# Patient Record
Sex: Female | Born: 1980 | Race: White | Hispanic: No | Marital: Single | Smoking: Former smoker
Health system: Southern US, Community
[De-identification: ages and names within clinical notes are randomized; demographics above are authoritative.]

## PROBLEM LIST (undated history)

## (undated) DIAGNOSIS — L309 Dermatitis, unspecified: Secondary | ICD-10-CM

## (undated) DIAGNOSIS — F419 Anxiety disorder, unspecified: Secondary | ICD-10-CM

## (undated) DIAGNOSIS — F3281 Premenstrual dysphoric disorder: Secondary | ICD-10-CM

## (undated) DIAGNOSIS — D219 Benign neoplasm of connective and other soft tissue, unspecified: Secondary | ICD-10-CM

## (undated) DIAGNOSIS — R112 Nausea with vomiting, unspecified: Secondary | ICD-10-CM

## (undated) DIAGNOSIS — R102 Pelvic and perineal pain: Secondary | ICD-10-CM

## (undated) DIAGNOSIS — Z7689 Persons encountering health services in other specified circumstances: Secondary | ICD-10-CM

## (undated) DIAGNOSIS — N946 Dysmenorrhea, unspecified: Secondary | ICD-10-CM

## (undated) DIAGNOSIS — IMO0002 Reserved for concepts with insufficient information to code with codable children: Secondary | ICD-10-CM

## (undated) DIAGNOSIS — N898 Other specified noninflammatory disorders of vagina: Secondary | ICD-10-CM

## (undated) DIAGNOSIS — D259 Leiomyoma of uterus, unspecified: Secondary | ICD-10-CM

## (undated) DIAGNOSIS — J45909 Unspecified asthma, uncomplicated: Secondary | ICD-10-CM

## (undated) DIAGNOSIS — B379 Candidiasis, unspecified: Secondary | ICD-10-CM

## (undated) DIAGNOSIS — N943 Premenstrual tension syndrome: Secondary | ICD-10-CM

## (undated) HISTORY — DX: Dysmenorrhea, unspecified: N94.6

## (undated) HISTORY — DX: Candidiasis, unspecified: B37.9

## (undated) HISTORY — DX: Unspecified asthma, uncomplicated: J45.909

## (undated) HISTORY — DX: Dermatitis, unspecified: L30.9

## (undated) HISTORY — DX: Leiomyoma of uterus, unspecified: D25.9

## (undated) HISTORY — DX: Premenstrual dysphoric disorder: F32.81

## (undated) HISTORY — DX: Pelvic and perineal pain: R10.2

## (undated) HISTORY — DX: Nausea with vomiting, unspecified: R11.2

## (undated) HISTORY — DX: Persons encountering health services in other specified circumstances: Z76.89

## (undated) HISTORY — DX: Other specified noninflammatory disorders of vagina: N89.8

## (undated) HISTORY — DX: Premenstrual tension syndrome: N94.3

## (undated) HISTORY — PX: CHOLECYSTECTOMY: SHX55

## (undated) HISTORY — DX: Anxiety disorder, unspecified: F41.9

## (undated) HISTORY — DX: Benign neoplasm of connective and other soft tissue, unspecified: D21.9

## (undated) HISTORY — DX: Reserved for concepts with insufficient information to code with codable children: IMO0002

---
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
## 2002-08-05 ENCOUNTER — Other Ambulatory Visit: Admission: RE | Admit: 2002-08-05 | Discharge: 2002-08-05 | Payer: Self-pay | Admitting: Obstetrics & Gynecology

## 2005-04-11 ENCOUNTER — Ambulatory Visit: Payer: Self-pay | Admitting: Internal Medicine

## 2005-05-17 ENCOUNTER — Ambulatory Visit: Payer: Self-pay | Admitting: Internal Medicine

## 2005-05-21 ENCOUNTER — Ambulatory Visit (HOSPITAL_COMMUNITY): Admission: RE | Admit: 2005-05-21 | Discharge: 2005-05-21 | Payer: Self-pay | Admitting: Internal Medicine

## 2005-08-15 ENCOUNTER — Ambulatory Visit: Payer: Self-pay | Admitting: Internal Medicine

## 2006-06-13 ENCOUNTER — Emergency Department (HOSPITAL_COMMUNITY): Admission: EM | Admit: 2006-06-13 | Discharge: 2006-06-13 | Payer: Self-pay | Admitting: Emergency Medicine

## 2010-03-22 ENCOUNTER — Ambulatory Visit (HOSPITAL_COMMUNITY): Admission: RE | Admit: 2010-03-22 | Discharge: 2010-03-22 | Payer: Self-pay | Admitting: Internal Medicine

## 2010-05-01 ENCOUNTER — Ambulatory Visit: Payer: Self-pay | Admitting: Gastroenterology

## 2010-05-01 DIAGNOSIS — R1013 Epigastric pain: Secondary | ICD-10-CM

## 2010-05-01 DIAGNOSIS — R740 Nonspecific elevation of levels of transaminase and lactic acid dehydrogenase [LDH]: Secondary | ICD-10-CM

## 2010-05-01 DIAGNOSIS — K8020 Calculus of gallbladder without cholecystitis without obstruction: Secondary | ICD-10-CM | POA: Insufficient documentation

## 2010-05-01 DIAGNOSIS — K219 Gastro-esophageal reflux disease without esophagitis: Secondary | ICD-10-CM

## 2010-05-01 DIAGNOSIS — K589 Irritable bowel syndrome without diarrhea: Secondary | ICD-10-CM

## 2010-05-03 ENCOUNTER — Encounter: Payer: Self-pay | Admitting: Internal Medicine

## 2010-05-19 ENCOUNTER — Encounter: Payer: Self-pay | Admitting: Internal Medicine

## 2010-05-29 ENCOUNTER — Ambulatory Visit: Payer: Self-pay | Admitting: Internal Medicine

## 2010-05-29 ENCOUNTER — Ambulatory Visit (HOSPITAL_COMMUNITY): Admission: RE | Admit: 2010-05-29 | Discharge: 2010-05-29 | Payer: Self-pay | Admitting: Internal Medicine

## 2010-07-22 ENCOUNTER — Observation Stay (HOSPITAL_COMMUNITY): Admission: EM | Admit: 2010-07-22 | Discharge: 2010-07-22 | Payer: Self-pay | Admitting: Emergency Medicine

## 2010-08-15 ENCOUNTER — Encounter: Payer: Self-pay | Admitting: Internal Medicine

## 2010-09-14 ENCOUNTER — Encounter: Payer: Self-pay | Admitting: Internal Medicine

## 2010-09-15 ENCOUNTER — Encounter: Payer: Self-pay | Admitting: Internal Medicine

## 2010-10-21 ENCOUNTER — Encounter (INDEPENDENT_AMBULATORY_CARE_PROVIDER_SITE_OTHER): Payer: Self-pay | Admitting: Internal Medicine

## 2010-10-31 NOTE — Letter (Signed)
Summary: [facility]  [facility]   Imported By: Rexene Alberts 05/03/2010 15:13:49  _____________________________________________________________________  External Attachment:    Type:   Image     Comment:   External Document

## 2010-10-31 NOTE — Letter (Signed)
Summary: [facility] CLINIC NOTE SURGERY  [facility] CLINIC NOTE SURGERY   Imported By: Rexene Alberts 08/15/2010 15:02:48  _____________________________________________________________________  External Attachment:    Type:   Image     Comment:   External Document

## 2010-10-31 NOTE — Assessment & Plan Note (Signed)
Summary: epigastric pain,nausea,vomiting/ss   Visit Type:  Initial Consult Referring Provider:  Free Webster Primary Care Provider:  Free Webster  Chief Complaint:  epigastric pain/n/v.  History of Present Illness: Lori Webster is a pleasant 30 y/o WF, patient of the Lori Webster, who presents for further evaluation of 7 month h/o progressive epigastric pain.  Symptoms started in Jan. 2011. First started with heartburn. Took TUMS but didn't help. Three weeks later. Epigastric pain, would wake up with it. Now will begin with bloating and gassy stomach before the epigastric pain. All happens within 30 mins. Not really related to meals necessarily. Would still have pain with fasting. Now some symptoms during day. Pain is severe when it occurs. At first tried Pepcid, no help. Then Dexilant, helped for little awhile but started back in 2 months. Lori Webster gave Zantac.  Also with pp loose stools, really bad for few weeks. Doesn't seem to matter what she eats. Eating bland foods. Used to have more constipation with alternating loose stool.  For past one week, pp one loose BM but sometimes several episodes. No blood in stool.   LABS 6/11: Tbili 0.3, AP 58, AST 19, ALT 167, alb 4.2, H.pylori negative. 5/11: amylase 72, lipase 25, ALT 109, CBC unremarkable, TSH 0.639  Abd U/S 03/22/10: Cholelithiasis, larger stone 10mm, no gb wall thickening, CBD 4mm  Current Medications (verified): 1)  Singulair 10 Mg Tabs (Montelukast Sodium) 2)  Ventolin Hfa 108 (90 Base) Mcg/act Aers (Albuterol Sulfate) .... As Needed 3)  Topamax 100 Mg Tabs (Topiramate) .... Take 1 Tablet By Mouth Two Times A Day 4)  Zantac 300 Mg Tabs (Ranitidine Hcl) .... One At Bedtime  Allergies: 1)  ! * Peanuts 2)  ! * Eggs 3)  ! * Milk  Past History:  Past Medical History: Migraines Asthma GERD Seasonal allergies Irritable Bowel Syndrome  Past Surgical History: Reconstructive surgery on face due to dogbite as a child  Family  History: Mother, gb out No FH of liver disease. No FH of CRC.   Social History: Single. No children. 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  Patient currently smokes, 2-3 cig per day at most. Etoh, once per month. Teenager, pot and cocaine, over 10 years ago. Smoking Status:  current  Review of Systems General:  Denies fever, chills, sweats, anorexia, weakness, and weight loss. Eyes:  Denies vision loss. ENT:  Denies nasal congestion, sore throat, hoarseness, and difficulty swallowing. CV:  Denies chest pains, angina, palpitations, dyspnea on exertion, and peripheral edema. Resp:  Denies dyspnea at rest, dyspnea with exercise, cough, sputum, and wheezing. GI:  See HPI. GU:  Denies urinary burning and blood in urine. MS:  Denies joint pain / LOM. Derm:  Denies rash and itching. Neuro:  Denies weakness, frequent headaches, memory loss, and confusion. Psych:  Denies depression and anxiety. Endo:  Denies unusual weight change. Heme:  Denies bruising and bleeding. Allergy:  Denies hives and rash.  Vital Signs:  Patient profile:   30 year old female Height:      63 inches Weight:      126 pounds BMI:     22.40 Temp:     98.9 degrees F oral Pulse rate:   64 / minute BP sitting:   98 / 56  (left arm) Cuff size:   regular  Vitals Entered By: Lori Spring LPN (May 01, 2010 1:13 PM)  Physical Exam  General:  Well developed, well nourished, no acute distress. Head:  Normocephalic and atraumatic. Eyes:  sclera nonicteric Mouth:  op moist Neck:  Supple; no masses or thyromegaly. Lungs:  Clear throughout to auscultation. Heart:  Regular rate and rhythm; no murmurs, rubs,  or bruits. Abdomen:  Mild RUQ/epig tenderness to deep palpation. No guarding or rebound. No HSM or masses. No abd bruit or hernia. Extremities:  No clubbing, cyanosis, edema or deformities noted. Neurologic:  Alert and  oriented x4;  grossly normal neurologically. Skin:  Intact without significant  lesions or rashes. Cervical Nodes:  No significant cervical adenopathy. Psych:  Alert and cooperative. Normal mood and affect.  Impression & Recommendations:  Problem # 1:  ABDOMINAL PAIN, EPIGASTRIC (ICD-789.06) Seven month h/o epigastric pain which is episodic, started mostly at night but now some during day. Not necessarily related to meals. ?due to GERD +/- cholelithiasis. Also with elevated ALT twice. Needs further evaluation. May need liver bx (at time of gb surgery) if remains elevated. Resume Dexilant 60mg  by mouth daily, #30 samples given. She will stop Zantac in three days. Will discuss with Dr. Jena Webster. It may be beneficial to look at her stomach prior to surgical consultation. Patient worried about costs. Orders: Consultation Level III [id number]) T-Hepatic Function [phone]) T-Hepatitis C Antibody [id number]) T-Hepatitis B Surface Antigen [phone]) T-Ferritin ([id number]) Lori Webster ([id number])  Problem # 2:  TRANSAMINASES, SERUM, ELEVATED (ICD-790.4) Check labs as outlined above. She will go by the Lori Webster to see if they can help with getting labs done. Orders: Consultation Level III ([id number])  Problem # 3:  IBS (ICD-564.1) PP loose stools likely due to IBS. Add dicyclomine as needed.  Orders: Consultation Level III ([id number]) Prescriptions: DICYCLOMINE HCL 10 MG CAPS (DICYCLOMINE HCL) one by mouth qac up to three times a day for diarrhea, abd cramps  #90 x 3   Entered and Authorized by:   Lori Battles. Dixon Webster   Signed by:   Lori Battles Kharson Rasmusson PA-C on 05/01/2010   Method used:   Electronically to        Lori Webster  [facility] [address] (retail)       [address]       [place], [place]  [address]       Ph: [phone]       Fax: [phone]   RxID:   [id number]  I would like to thank the Lori Webster for allowing [place] to take part in the care of this nice patient.  Appended Document: epigastric pain,nausea,vomiting/ss Discussed with Lori Webster.    He recommends EGD prior to going to surgeon about gb. See if Lori Webster can provide financial assistance for pt. If pt refused, the next best thing would be UGI series.   Appended Document: epigastric pain,nausea,vomiting/ss spoke with pt- gave her number for [facility]. She wants to call and talk with her first and will call back to schedule appt.  Appended Document: epigastric pain,nausea,vomiting/ss Please f/u with pt to see what she has decided to do. EGD vs UGI prior to surgical consultation.  Appended Document: epigastric pain,nausea,vomiting/ss Pt Left VM to call, i returned her call, her VM is full and i could not leave a message.   Appended Document: epigastric pain,nausea,vomiting/ss Pt decided to do the EGD. She is scheduled for 05/29/2010 @1 :00 PM with Dr. Jena Webster. Selena Batten is aware. Reviewed instructions with pt and mailed copy to her.

## 2010-11-02 NOTE — Letter (Signed)
Summary: [facility] CLINIC NOTE  [facility] CLINIC NOTE   Imported By: Rexene Alberts 09/14/2010 09:34:06  _____________________________________________________________________  External Attachment:    Type:   Image     Comment:   External Document

## 2010-11-02 NOTE — Letter (Signed)
Summary: [facility] CLINIC NOTE  [facility] CLINIC NOTE   Imported By: Rexene Alberts 09/15/2010 12:08:47  _____________________________________________________________________  External Attachment:    Type:   Image     Comment:   External Document

## 2010-11-03 NOTE — Letter (Signed)
Summary: Internal Other /EGD ORDER  Internal Other /EGD ORDER   Imported By: Cloria Spring LPN 04/54/0981 19:14:78  _____________________________________________________________________  External Attachment:    Type:   Image     Comment:   External Document

## 2010-11-03 NOTE — Letter (Signed)
Summary: LABS  LABS   Imported By: Rexene Alberts 08/15/2010 11:57:54  _____________________________________________________________________  External Attachment:    Type:   Image     Comment:   External Document

## 2014-02-16 ENCOUNTER — Encounter: Payer: Self-pay | Admitting: *Deleted

## 2014-02-23 ENCOUNTER — Encounter: Payer: Self-pay | Admitting: Adult Health

## 2014-04-15 ENCOUNTER — Ambulatory Visit (INDEPENDENT_AMBULATORY_CARE_PROVIDER_SITE_OTHER): Payer: BC Managed Care – PPO | Admitting: Adult Health

## 2014-04-15 ENCOUNTER — Encounter: Payer: Self-pay | Admitting: Adult Health

## 2014-04-15 VITALS — BP 112/64 | Ht 63.0 in | Wt 135.0 lb

## 2014-04-15 DIAGNOSIS — N920 Excessive and frequent menstruation with regular cycle: Secondary | ICD-10-CM | POA: Insufficient documentation

## 2014-04-15 DIAGNOSIS — N946 Dysmenorrhea, unspecified: Secondary | ICD-10-CM

## 2014-04-15 DIAGNOSIS — Z7689 Persons encountering health services in other specified circumstances: Secondary | ICD-10-CM

## 2014-04-15 DIAGNOSIS — B379 Candidiasis, unspecified: Secondary | ICD-10-CM

## 2014-04-15 DIAGNOSIS — Z87440 Personal history of urinary (tract) infections: Secondary | ICD-10-CM

## 2014-04-15 DIAGNOSIS — N898 Other specified noninflammatory disorders of vagina: Secondary | ICD-10-CM | POA: Insufficient documentation

## 2014-04-15 HISTORY — DX: Persons encountering health services in other specified circumstances: Z76.89

## 2014-04-15 HISTORY — DX: Candidiasis, unspecified: B37.9

## 2014-04-15 HISTORY — DX: Dysmenorrhea, unspecified: N94.6

## 2014-04-15 HISTORY — DX: Other specified noninflammatory disorders of vagina: N89.8

## 2014-04-15 LAB — POCT URINALYSIS DIPSTICK
Glucose, UA: NEGATIVE
Ketones, UA: NEGATIVE
Leukocytes, UA: NEGATIVE
Nitrite, UA: NEGATIVE
Protein, UA: NEGATIVE

## 2014-04-15 LAB — POCT WET PREP (WET MOUNT): WBC WET PREP: NEGATIVE

## 2014-04-15 MED ORDER — NORETHIN-ETH ESTRAD-FE BIPHAS 1 MG-10 MCG / 10 MCG PO TABS: 1.0000 | ORAL_TABLET | Freq: Every day | ORAL | Status: DC

## 2014-04-15 MED ORDER — FLUCONAZOLE 150 MG PO TABS: ORAL_TABLET | ORAL | Status: DC

## 2014-04-15 NOTE — Patient Instructions (Signed)
Monilial Vaginitis Vaginitis in a soreness, swelling and redness (inflammation) of the vagina and vulva. Monilial vaginitis is not a sexually transmitted infection. CAUSES  Yeast vaginitis is caused by yeast (candida) that is normally found in your vagina. With a yeast infection, the candida has overgrown in number to a point that upsets the chemical balance. SYMPTOMS  White, thick vaginal discharge. Swelling, itching, redness and irritation of the vagina and possibly the lips of the vagina (vulva). Burning or painful urination. Painful intercourse. DIAGNOSIS  Things that may contribute to monilial vaginitis are: Postmenopausal and virginal states. Pregnancy. Infections. Being tired, sick or stressed, especially if you had monilial vaginitis in the past. Diabetes. Good control will help lower the chance. Birth control pills. Tight fitting garments. Using bubble bath, feminine sprays, douches or deodorant tampons. Taking certain medications that kill germs (antibiotics). Sporadic recurrence can occur if you become ill. TREATMENT  Your caregiver will give you medication. There are several kinds of anti monilial vaginal creams and suppositories specific for monilial vaginitis. For recurrent yeast infections, use a suppository or cream in the vagina 2 times a week, or as directed. Anti-monilial or steroid cream for the itching or irritation of the vulva may also be used. Get your caregiver's permission. Painting the vagina with methylene blue solution may help if the monilial cream does not work. Eating yogurt may help prevent monilial vaginitis. HOME CARE INSTRUCTIONS  Finish all medication as prescribed. Do not have sex until treatment is completed or after your caregiver tells you it is okay. Take warm sitz baths. Do not douche. Do not use tampons, especially scented ones. Wear cotton underwear. Avoid tight pants and panty hose. Tell your sexual partner that you have a yeast  infection. They should go to their caregiver if they have symptoms such as mild rash or itching. Your sexual partner should be treated as well if your infection is difficult to eliminate. Practice safer sex. Use condoms. Some vaginal medications cause latex condoms to fail. Vaginal medications that harm condoms are: Cleocin cream. Butoconazole (Femstat). Terconazole (Terazol) vaginal suppository. Miconazole (Monistat) (may be purchased over the counter). SEEK MEDICAL CARE IF:  You have a temperature by mouth above 102 F (38.9 C). The infection is getting worse after 2 days of treatment. The infection is not getting better after 3 days of treatment. You develop blisters in or around your vagina. You develop vaginal bleeding, and it is not your menstrual period. You have pain when you urinate. You develop intestinal problems. You have pain with sexual intercourse. Document Released: 06/27/2005 Document Revised: 12/10/2011 Document Reviewed: 03/11/2009 [facility] Patient Information 2015 [place], [place]. This information is not intended to replace advice given to you by your health care provider. Make sure you discuss any questions you have with your health care provider. Oral Contraception Use Oral contraceptive pills (OCPs) are medicines taken to prevent pregnancy. OCPs work by preventing the ovaries from releasing eggs. The hormones in OCPs also cause the cervical mucus to thicken, preventing the sperm from entering the uterus. The hormones also cause the uterine lining to become thin, not allowing a fertilized egg to attach to the inside of the uterus. OCPs are highly effective when taken exactly as prescribed. However, OCPs do not prevent sexually transmitted diseases (STDs). Safe sex practices, such as using condoms along with an OCP, can help prevent STDs. Before taking OCPs, you may have a physical exam and Pap test. Your health care provider may also order blood tests if necessary. Your  health care provider will make sure you are a good candidate for oral contraception. Discuss with your health care provider the possible side effects of the OCP you may be prescribed. When starting an OCP, it can take 2 to 3 months for the body to adjust to the changes in hormone levels in your body.  HOW TO TAKE ORAL CONTRACEPTIVE PILLS Your health care provider may advise you on how to start taking the first cycle of OCPs. Otherwise, you can:   Start on day 1 of your menstrual period. You will not need any backup contraceptive protection with this start time.   Start on the first Sunday after your menstrual period or the day you get your prescription. In these cases, you will need to use backup contraceptive protection for the first week.   Start the pill at any time of your cycle. If you take the pill within 5 days of the start of your period, you are protected against pregnancy right away. In this case, you will not need a backup form of birth control. If you start at any other time of your menstrual cycle, you will need to use another form of birth control for 7 days. If your OCP is the type called a minipill, it will protect you from pregnancy after taking it for 2 days (48 hours). After you have started taking OCPs:   If you forget to take 1 pill, take it as soon as you remember. Take the next pill at the regular time.   If you miss 2 or more pills, call your health care provider because different pills have different instructions for missed doses. Use backup birth control until your next menstrual period starts.   If you use a 28-day pack that contains inactive pills and you miss 1 of the last 7 pills (pills with no hormones), it will not matter. Throw away the rest of the nonhormone pills and start a new pill pack.  No matter which day you start the OCP, you will always start a new pack on that same day of the week. Have an extra pack of OCPs and a backup contraceptive method available  in case you miss some pills or lose your OCP pack.  HOME CARE INSTRUCTIONS   Do not smoke.   Always use a condom to protect against STDs. OCPs do not protect against STDs.   Use a calendar to mark your menstrual period days.   Read the information and directions that came with your OCP. Talk to your health care provider if you have questions.  SEEK MEDICAL CARE IF:   You develop nausea and vomiting.   You have abnormal vaginal discharge or bleeding.   You develop a rash.   You miss your menstrual period.   You are losing your hair.   You need treatment for mood swings or depression.   You get dizzy when taking the OCP.   You develop acne from taking the OCP.   You become pregnant.  SEEK IMMEDIATE MEDICAL CARE IF:   You develop chest pain.   You develop shortness of breath.   You have an uncontrolled or severe headache.   You develop numbness or slurred speech.   You develop visual problems.   You develop pain, redness, and swelling in the legs.  Document Released: 09/06/2011 Document Revised: 05/20/2013 Document Reviewed: 03/08/2013 [facility] Patient Information 2015 [place], [place]. This information is not intended to replace advice given to you by your health care provider.  Make sure you discuss any questions you have with your health care provider. Dysmenorrhea Menstrual cramps (dysmenorrhea) are caused by the muscles of the uterus tightening (contracting) during a menstrual period. For some women, this discomfort is merely bothersome. For others, dysmenorrhea can be severe enough to interfere with everyday activities for a few days each month. Primary dysmenorrhea is menstrual cramps that last a couple of days when you start having menstrual periods or soon after. This often begins after a teenager starts having her period. As a woman gets older or has a baby, the cramps will usually lessen or disappear. Secondary dysmenorrhea begins later in life,  lasts longer, and the pain may be stronger than primary dysmenorrhea. The pain may start before the period and last a few days after the period.  CAUSES  Dysmenorrhea is usually caused by an underlying problem, such as:  The tissue lining the uterus grows outside of the uterus in other areas of the body (endometriosis).  The endometrial tissue, which normally lines the uterus, is found in or grows into the muscular walls of the uterus (adenomyosis).  The pelvic blood vessels are engorged with blood just before the menstrual period (pelvic congestive syndrome).  Overgrowth of cells (polyps) in the lining of the uterus or cervix.  Falling down of the uterus (prolapse) because of loose or stretched ligaments.  Depression.  Bladder problems, infection, or inflammation.  Problems with the intestine, a tumor, or irritable bowel syndrome.  Cancer of the female organs or bladder.  A severely tipped uterus.  A very tight opening or closed cervix.  Noncancerous tumors of the uterus (fibroids).  Pelvic inflammatory disease (PID).  Pelvic scarring (adhesions) from a previous surgery.  Ovarian cyst.  An intrauterine device (IUD) used for birth control. RISK FACTORS You may be at greater risk of dysmenorrhea if:  You are younger than age 64.  You started puberty early.  You have irregular or heavy bleeding.  You have never given birth.  You have a family history of this problem.  You are a smoker. SIGNS AND SYMPTOMS   Cramping or throbbing pain in your lower abdomen.  Headaches.  Lower back pain.  Nausea or vomiting.  Diarrhea.  Sweating or dizziness.  Loose stools. DIAGNOSIS  A diagnosis is based on your history, symptoms, physical exam, diagnostic tests, or procedures. Diagnostic tests or procedures may include:  Blood tests.  Ultrasonography.  An examination of the lining of the uterus (dilation and curettage, D&C).  An examination inside your abdomen or  pelvis with a scope (laparoscopy).  X-rays.  CT scan.  MRI.  An examination inside the bladder with a scope (cystoscopy).  An examination inside the intestine or stomach with a scope (colonoscopy, gastroscopy). TREATMENT  Treatment depends on the cause of the dysmenorrhea. Treatment may include:  Pain medicine prescribed by your health care provider.  Birth control pills or an IUD with progesterone hormone in it.  Hormone replacement therapy.  Nonsteroidal anti-inflammatory drugs (NSAIDs). These may help stop the production of prostaglandins.  Surgery to remove adhesions, endometriosis, ovarian cyst, or fibroids.  Removal of the uterus (hysterectomy).  Progesterone shots to stop the menstrual period.  Cutting the nerves on the sacrum that go to the female organs (presacral neurectomy).  Electric current to the sacral nerves (sacral nerve stimulation).  Antidepressant medicine.  Psychiatric therapy, counseling, or group therapy.  Exercise and physical therapy.  Meditation and yoga therapy.  Acupuncture. HOME CARE INSTRUCTIONS   Only take over-the-counter or  prescription medicines as directed by your health care provider.  Place a heating pad or hot water bottle on your lower back or abdomen. Do not sleep with the heating pad.  Use aerobic exercises, walking, swimming, biking, and other exercises to help lessen the cramping.  Massage to the lower back or abdomen may help.  Stop smoking.  Avoid alcohol and caffeine. SEEK MEDICAL CARE IF:   Your pain does not get better with medicine.  You have pain with sexual intercourse.  Your pain increases and is not controlled with medicines.  You have abnormal vaginal bleeding with your period.  You develop nausea or vomiting with your period that is not controlled with medicine. SEEK IMMEDIATE MEDICAL CARE IF:  You pass out.  Document Released: 09/17/2005 Document Revised: 05/20/2013 Document Reviewed:  03/05/2013 [facility] Patient Information 2015 [place], [place]. This information is not intended to replace advice given to you by your health care provider. Make sure you discuss any questions you have with your health care provider. Menorrhagia Menorrhagia is the medical term for when your menstrual periods are heavy or last longer than usual. With menorrhagia, every period you have may cause enough blood loss and cramping that you are unable to maintain your usual activities. CAUSES  In some cases, the cause of heavy periods is unknown, but a number of conditions may cause menorrhagia. Common causes include:  A problem with the hormone-producing thyroid gland (hypothyroid).  Noncancerous growths in the uterus (polyps or fibroids).  An imbalance of the estrogen and progesterone hormones.  One of your ovaries not releasing an egg during one or more months.  Side effects of having an intrauterine device (IUD).  Side effects of some medicines, such as anti-inflammatory medicines or blood thinners.  A bleeding disorder that stops your blood from clotting normally. SIGNS AND SYMPTOMS  During a normal period, bleeding lasts between 4 and 8 days. Signs that your periods are too heavy include:  You routinely have to change your pad or tampon every 1 or 2 hours because it is completely soaked.  You pass blood clots larger than 1 inch (2.5 cm) in size.  You have bleeding for more than 7 days.  You need to use pads and tampons at the same time because of heavy bleeding.  You need to wake up to change your pads or tampons during the night.  You have symptoms of anemia, such as tiredness, fatigue, or shortness of breath. DIAGNOSIS  Your health care provider will perform a physical exam and ask you questions about your symptoms and menstrual history. Other tests may be ordered based on what the health care provider finds during the exam. These tests can include:  Blood tests. Blood tests are  used to check if you are pregnant or have hormonal changes, a bleeding or thyroid disorder, low iron levels (anemia), or other problems.  Endometrial biopsy. Your health care provider takes a sample of tissue from the inside of your uterus to be examined under a microscope.  Pelvic ultrasound. This test uses sound waves to make a picture of your uterus, ovaries, and vagina. The pictures can show if you have fibroids or other growths.  Hysteroscopy. For this test, your health care provider will use a small telescope to look inside your uterus. Based on the results of your initial tests, your health care provider may recommend further testing. TREATMENT  Treatment may not be needed. If it is needed, your health care provider may recommend treatment with one  or more medicines first. If these do not reduce bleeding enough, a surgical treatment might be an option. The best treatment for you will depend on:   Whether you need to prevent pregnancy.  Your desire to have children in the future.  The cause and severity of your bleeding.  Your opinion and personal preference.  Medicines for menorrhagia may include:  Birth control methods that use hormones. These include the pill, skin patch, vaginal ring, shots that you get every 3 months, hormonal IUD, and implant. These treatments reduce bleeding during your menstrual period.  Medicines that thicken blood and slow bleeding.  Medicines that reduce swelling, such as ibuprofen.  Medicines that contain a synthetic hormone called progestin.   Medicines that make the ovaries stop working for a short time.  You may need surgical treatment for menorrhagia if the medicines are unsuccessful. Treatment options include:  Dilation and curettage (D&C). In this procedure, your health care provider opens (dilates) your cervix and then scrapes or suctions tissue from the lining of your uterus to reduce menstrual bleeding.  Operative hysteroscopy. This  procedure uses a tiny tube with a light (hysteroscope) to view your uterine cavity and can help in the surgical removal of a polyp that may be causing heavy periods.  Endometrial ablation. Through various techniques, your health care provider permanently destroys the entire lining of your uterus (endometrium). After endometrial ablation, most women have little or no menstrual flow. Endometrial ablation reduces your ability to become pregnant.  Endometrial resection. This surgical procedure uses an electrosurgical wire loop to remove the lining of the uterus. This procedure also reduces your ability to become pregnant.  Hysterectomy. Surgical removal of the uterus and cervix is a permanent procedure that stops menstrual periods. Pregnancy is not possible after a hysterectomy. This procedure requires anesthesia and hospitalization. HOME CARE INSTRUCTIONS   Only take over-the-counter or prescription medicines as directed by your health care provider. Take prescribed medicines exactly as directed. Do not change or switch medicines without consulting your health care provider.  Take any prescribed iron pills exactly as directed by your health care provider. Long-term heavy bleeding may result in low iron levels. Iron pills help replace the iron your body lost from heavy bleeding. Iron may cause constipation. If this becomes a problem, increase the bran, fruits, and roughage in your diet.  Do not take aspirin or medicines that contain aspirin 1 week before or during your menstrual period. Aspirin may make the bleeding worse.  If you need to change your sanitary pad or tampon more than once every 2 hours, stay in bed and rest as much as possible until the bleeding stops.  Eat well-balanced meals. Eat foods high in iron. Examples are leafy green vegetables, meat, liver, eggs, and whole grain breads and cereals. Do not try to lose weight until the abnormal bleeding has stopped and your blood iron level is  back to normal. SEEK MEDICAL CARE IF:   You soak through a pad or tampon every 1 or 2 hours, and this happens every time you have a period.  You need to use pads and tampons at the same time because you are bleeding so much.  You need to change your pad or tampon during the night.  You have a period that lasts for more than 8 days.  You pass clots bigger than 1 inch wide.  You have irregular periods that happen more or less often than once a month.  You feel dizzy or faint.  You feel very weak or tired.  You feel short of breath or feel your heart is beating too fast when you exercise.  You have nausea and vomiting or diarrhea while you are taking your medicine.  You have any problems that may be related to the medicine you are taking. SEEK IMMEDIATE MEDICAL CARE IF:   You soak through 4 or more pads or tampons in 2 hours.  You have any bleeding while you are pregnant. MAKE SURE YOU:   Understand these instructions.  Will watch your condition.  Will get help right away if you are not doing well or get worse. Document Released: 09/17/2005 Document Revised: 09/22/2013 Document Reviewed: 03/08/2013 [facility] Patient Information 2015 [place], [place]. This information is not intended to replace advice given to you by your health care provider. Make sure you discuss any questions you have with your health care provider. Start OCs on sunday after period starts if starts on Sunday take it that day USE condoms

## 2014-04-15 NOTE — Progress Notes (Signed)
Subjective:     Patient ID: Lori Webster, female   DOB: 1981/07/21, 33 y.o.   MRN: [id number]  HPI Manmeet is a 33 year old white female, single, new to this practice,complaining of heavy painful periods.On heaviest days changes tampon and pad every 2-3 hours, no clots.They are regular, and last about 5 days, and she has PMDD and is on prozac 20 mg.She has nausea and vomiting with periods and sometimes a week before.Has never had sex and has never been on birth control. She is taking antibiotic for UTI and thinks she has yeast infection.She said PCP told her she may have endometriosis.Has migraines with out aura, no history of DVT or cancer.She ask for xanax and I told her she gets that from [employer], [place] at [place], to call his office for refill or dose change.  Review of Systems See HPI Reviewed past medical,surgical, social and family history. Reviewed medications and allergies.     Objective:   Physical Exam BP 112/64  Ht 5\' 3"  (1.6 m)  Wt 135 lb (61.236 kg)  BMI 23.92 kg/m2  LMP 07/01/2015Urine trace blood, Skin warm and dry.Pelvic: external genitalia is normal in appearance, has healing abscess right groin, vagina: white discharge without odor, cervix:smooth, uterus: normal size, shape and contour, non tender, no masses felt, adnexa: no masses or tenderness noted. Wet prep: + yeast bud.Discussed try OCs to help with periods and she is aware with risk/benefits, she says she would like kids one day.   Spent over 30 minutes face to face.  Assessment:    Period management Menorrhagia Dysmenorrhea Vaginal discharge  Yeast  History of UTI    Plan:    Rx diflucan 150 mg #2 1 now and 1 in 3 days with 1 refill Rx lo loestrin start with next period, Number of samples 3 Lot number [id number]    Exp date 4/16 IF has sex use condoms Decrease cigarettes Review handout on menorrhagia,dysmenorrhea and yeast Return in  2 months for pap and physical and ROS

## 2014-06-16 ENCOUNTER — Other Ambulatory Visit: Payer: BC Managed Care – PPO | Admitting: Adult Health

## 2014-06-17 ENCOUNTER — Other Ambulatory Visit (HOSPITAL_COMMUNITY)
Admission: RE | Admit: 2014-06-17 | Discharge: 2014-06-17 | Disposition: A | Discharge status: Home / Self Care | Payer: BC Managed Care – PPO | Source: Ambulatory Visit | Attending: Adult Health | Admitting: Adult Health

## 2014-06-17 ENCOUNTER — Encounter: Payer: Self-pay | Admitting: Adult Health

## 2014-06-17 ENCOUNTER — Ambulatory Visit (INDEPENDENT_AMBULATORY_CARE_PROVIDER_SITE_OTHER): Payer: BC Managed Care – PPO | Admitting: Adult Health

## 2014-06-17 VITALS — BP 128/70 | HR 76 | Ht 64.25 in | Wt 139.0 lb

## 2014-06-17 DIAGNOSIS — R87810 Cervical high risk human papillomavirus (HPV) DNA test positive: Secondary | ICD-10-CM | POA: Diagnosis present

## 2014-06-17 DIAGNOSIS — Z1151 Encounter for screening for human papillomavirus (HPV): Secondary | ICD-10-CM | POA: Diagnosis present

## 2014-06-17 DIAGNOSIS — Z7689 Persons encountering health services in other specified circumstances: Secondary | ICD-10-CM

## 2014-06-17 DIAGNOSIS — Z01419 Encounter for gynecological examination (general) (routine) without abnormal findings: Secondary | ICD-10-CM

## 2014-06-17 MED ORDER — NORETHINDRONE ACET-ETHINYL EST 1-20 MG-MCG PO TABS: 1.0000 | ORAL_TABLET | Freq: Every day | ORAL | Status: DC

## 2014-06-17 NOTE — Patient Instructions (Signed)
Continue OCs physical in 1 year

## 2014-06-17 NOTE — Progress Notes (Signed)
Patient ID: Lori Webster, female   DOB: 02-23-1981, 33 y.o.   MRN: [id number] History of Present Illness: Lori Webster is a 33 year old white female in for a pap and physical.Her periods are better with OCs.Has had some breast tenderness and moods better.    Current Medications, Allergies, Past Medical History, Past Surgical History, Family History and Social History were reviewed in [employer] record.     Review of Systems: Patient denies any headaches, blurred vision, shortness of breath, chest pain, abdominal pain, problems with bowel movements, urination, or intercourse. Not having sex at present, no joint pain but legs ache with periods some,moods good.    Physical Exam:BP 128/70  Pulse 76  Ht 5' 4.25" (1.632 m)  Wt 139 lb (63.05 kg)  BMI 23.67 kg/m2  LMP 06/09/2014 General:  Well developed, well nourished, no acute distress Skin:  Warm and dry Neck:  Midline trachea, normal thyroid Lungs; Clear to auscultation bilaterally Breast:  No dominant palpable mass, retraction, or nipple discharge Cardiovascular: Regular rate and rhythm Abdomen:  Soft, non tender, no hepatosplenomegaly, hs navel ring Pelvic:  External genitalia is normal in appearance.  The vagina is normal in appearance.  The cervix is smooth and deviated slightly to left, pap with HPV performed.  Uterus is felt to be normal size, shape, and contour.  No adnexal masses or tenderness noted. Extremities:  No swelling or varicosities noted Psych:  No mood changes, alert and cooperative,seems happy   Impression: Yearly gyn exam  Period management    Plan: Physical in 1 year Rx Junel 1-20 take 1 daily disp 1 pack with 11 refills, will start after finishes lo loestrin(insurance will not cover lo loestrin) Call prn Use condoms

## 2014-06-22 ENCOUNTER — Encounter: Payer: Self-pay | Admitting: Adult Health

## 2014-06-22 ENCOUNTER — Telehealth: Payer: Self-pay | Admitting: Adult Health

## 2014-06-22 DIAGNOSIS — IMO0002 Reserved for concepts with insufficient information to code with codable children: Secondary | ICD-10-CM

## 2014-06-22 HISTORY — DX: Reserved for concepts with insufficient information to code with codable children: IMO0002

## 2014-06-22 LAB — CYTOLOGY - PAP

## 2014-06-22 NOTE — Telephone Encounter (Signed)
Left message to call me back.

## 2014-06-22 NOTE — Telephone Encounter (Signed)
Pt aware pap negative for malignancy but +HPV, repeat pap in 1 year with HPV

## 2015-03-23 ENCOUNTER — Telehealth: Payer: Self-pay | Admitting: Adult Health

## 2015-03-23 ENCOUNTER — Other Ambulatory Visit: Payer: Self-pay | Admitting: Adult Health

## 2015-03-23 NOTE — Telephone Encounter (Signed)
Spoke with pharmacist at [facility] in [place]. They requested a 3 month refill on her birth control pills, Microgestin. I spoke with Maudie Mercury, CNM and she approved the refill. [place]

## 2015-12-09 ENCOUNTER — Other Ambulatory Visit: Payer: Self-pay | Admitting: Adult Health

## 2016-02-23 ENCOUNTER — Other Ambulatory Visit: Payer: Self-pay | Admitting: Adult Health

## 2016-03-30 ENCOUNTER — Other Ambulatory Visit: Payer: Self-pay | Admitting: Adult Health

## 2016-04-13 ENCOUNTER — Ambulatory Visit (INDEPENDENT_AMBULATORY_CARE_PROVIDER_SITE_OTHER): Payer: BLUE CROSS/BLUE SHIELD | Admitting: Adult Health

## 2016-04-13 ENCOUNTER — Encounter: Payer: Self-pay | Admitting: Adult Health

## 2016-04-13 VITALS — BP 108/78 | HR 80 | Ht 64.0 in | Wt 152.5 lb

## 2016-04-13 DIAGNOSIS — N946 Dysmenorrhea, unspecified: Secondary | ICD-10-CM

## 2016-04-13 DIAGNOSIS — R112 Nausea with vomiting, unspecified: Secondary | ICD-10-CM | POA: Diagnosis not present

## 2016-04-13 DIAGNOSIS — N943 Premenstrual tension syndrome: Secondary | ICD-10-CM | POA: Diagnosis not present

## 2016-04-13 HISTORY — DX: Premenstrual tension syndrome: N94.3

## 2016-04-13 HISTORY — DX: Nausea with vomiting, unspecified: R11.2

## 2016-04-13 MED ORDER — FLUOXETINE HCL 20 MG PO TABS: 20.0000 mg | ORAL_TABLET | Freq: Every day | ORAL | Status: DC

## 2016-04-13 MED ORDER — NAPROXEN SODIUM 550 MG PO TABS: 550.0000 mg | ORAL_TABLET | Freq: Two times a day (BID) | ORAL | Status: DC

## 2016-04-13 NOTE — Patient Instructions (Signed)
Premenstrual Syndrome Premenstrual syndrome (PMS) is a condition that consists of physical, emotional, and behavioral symptoms that affect women of childbearing age. PMS occurs 5-14 days before the start of a menstrual period and often recurs in a predictable pattern. The symptoms go away a few days after the menstrual period starts. PMS can interfere in many ways with normal daily activities and can range from mild to severe. When PMS is considered severe, it may be diagnosed as premenstrual dysphoric disorder (PMDD). A small percentage of women are affected by PMS symptoms and an even smaller percentage of those women are affected by PMDD.  CAUSES  The exact cause of PMS is unknown, but it seems to be related to cyclic hormone changes that happen before menstruation. These hormones are thought to affect chemicals in the brain (serotonin) that can influence a person's mood.  SYMPTOMS  Symptoms of PMS recur consistently from month to month and go away completely after the menstrual period starts. The most common emotional or behavioral symptom is mood swings. These mood swings can be disabling and interfere with normal activities of daily living. Other common symptoms include depression and angry outbursts. Other symptoms may include:   Irritability.  Anxiety.  Crying spells.   Food cravings or appetite changes.   Changes in sexual desire.   Confusion.   Aggression.   Social withdrawal.   Poor concentration. The most common physical symptoms include a sense of bloating, breast pain, headaches, and extreme fatigue. Other physical symptoms include:   Backaches.   Swelling of the hands and feet.   Weight gain.   Hot flashes.  DIAGNOSIS  To make a diagnosis, your caregiver will ask questions to confirm that you are having a pattern of symptoms. Symptoms must:   Be present 5 days before the start of your period and be present at least 3 months in a row.   End within 4 days  after your period starts.   Interfere with some of your normal activities.  Other conditions, such as thyroid disease, depression, and migraine headaches must be ruled out before a diagnosis of PMS is confirmed.  TREATMENT  Your caregiver may suggest ways to maintain a healthy lifestyle, such as exercise. Over-the-counter pain relievers may ease cramps, aches, pains, headaches, and breast tenderness. However, selective serotonin reuptake inhibitors (SSRIs) are medicines that are most beneficial in improving PMS if taken in the second half of the monthly cycle. They may be taken on a daily basis. The most effective oral contraceptive pill used for symptoms of PMS is one that contains the ingredient drospirenone. Taking 4 days off of the pill instead of the usual 7 days also has shown to increase effectiveness.  There are a number of drugs, dietary supplements, vitamins, and water pills (diuretics) which have been suggested to be helpful but have not shown to be of any benefit to improving PMS symptoms.  HOME CARE INSTRUCTIONS   For 2-3 months, write down your symptoms, their severity, and how long they last. This may help your caregiver prescribe the best treatment for your symptoms.  Exercise regularly as suggested by your caregiver.  Eat a regular, well-balanced diet.  Avoid caffeine, alcohol, and tobacco consumption.  Limit salt and salty foods to lessen bloating and fluid retention.  Get enough sleep. Practice relaxation techniques.  Drink enough fluids to keep your urine clear or pale yellow.  Take medicines as directed by your caregiver.  Limit stress.  Take a multivitamin as directed by your   caregiver.   This information is not intended to replace advice given to you by your health care provider. Make sure you discuss any questions you have with your health care provider.   Document Released: 09/14/2000 Document Revised: 06/11/2012 Document Reviewed: 02/04/2012 Elsevier  Interactive Patient Education 2016 [employer] Dysmenorrhea Menstrual cramps (dysmenorrhea) are caused by the muscles of the uterus tightening (contracting) during a menstrual period. For some women, this discomfort is merely bothersome. For others, dysmenorrhea can be severe enough to interfere with everyday activities for a few days each month. Primary dysmenorrhea is menstrual cramps that last a couple of days when you start having menstrual periods or soon after. This often begins after a teenager starts having her period. As a woman gets older or has a baby, the cramps will usually lessen or disappear. Secondary dysmenorrhea begins later in life, lasts longer, and the pain may be stronger than primary dysmenorrhea. The pain may start before the period and last a few days after the period.  CAUSES  Dysmenorrhea is usually caused by an underlying problem, such as:  The tissue lining the uterus grows outside of the uterus in other areas of the body (endometriosis).  The endometrial tissue, which normally lines the uterus, is found in or grows into the muscular walls of the uterus (adenomyosis).  The pelvic blood vessels are engorged with blood just before the menstrual period (pelvic congestive syndrome).  Overgrowth of cells (polyps) in the lining of the uterus or cervix.  Falling down of the uterus (prolapse) because of loose or stretched ligaments.  Depression.  Bladder problems, infection, or inflammation.  Problems with the intestine, a tumor, or irritable bowel syndrome.  Cancer of the female organs or bladder.  A severely tipped uterus.  A very tight opening or closed cervix.  Noncancerous tumors of the uterus (fibroids).  Pelvic inflammatory disease (PID).  Pelvic scarring (adhesions) from a previous surgery.  Ovarian cyst.  An intrauterine device (IUD) used for birth control. RISK FACTORS You may be at greater risk of dysmenorrhea if:  You are younger than age  15.  You started puberty early.  You have irregular or heavy bleeding.  You have never given birth.  You have a family history of this problem.  You are a smoker. SIGNS AND SYMPTOMS   Cramping or throbbing pain in your lower abdomen.  Headaches.  Lower back pain.  Nausea or vomiting.  Diarrhea.  Sweating or dizziness.  Loose stools. DIAGNOSIS  A diagnosis is based on your history, symptoms, physical exam, diagnostic tests, or procedures. Diagnostic tests or procedures may include:  Blood tests.  Ultrasonography.  An examination of the lining of the uterus (dilation and curettage, D&C).  An examination inside your abdomen or pelvis with a scope (laparoscopy).  X-rays.  CT scan.  MRI.  An examination inside the bladder with a scope (cystoscopy).  An examination inside the intestine or stomach with a scope (colonoscopy, gastroscopy). TREATMENT  Treatment depends on the cause of the dysmenorrhea. Treatment may include:  Pain medicine prescribed by your health care provider.  Birth control pills or an IUD with progesterone hormone in it.  Hormone replacement therapy.  Nonsteroidal anti-inflammatory drugs (NSAIDs). These may help stop the production of prostaglandins.  Surgery to remove adhesions, endometriosis, ovarian cyst, or fibroids.  Removal of the uterus (hysterectomy).  Progesterone shots to stop the menstrual period.  Cutting the nerves on the sacrum that go to the female organs (presacral neurectomy).  Electric current to  the sacral nerves (sacral nerve stimulation).  Antidepressant medicine.  Psychiatric therapy, counseling, or group therapy.  Exercise and physical therapy.  Meditation and yoga therapy.  Acupuncture. HOME CARE INSTRUCTIONS   Only take over-the-counter or prescription medicines as directed by your health care provider.  Place a heating pad or hot water bottle on your lower back or abdomen. Do not sleep with the  heating pad.  Use aerobic exercises, walking, swimming, biking, and other exercises to help lessen the cramping.  Massage to the lower back or abdomen may help.  Stop smoking.  Avoid alcohol and caffeine. SEEK MEDICAL CARE IF:   Your pain does not get better with medicine.  You have pain with sexual intercourse.  Your pain increases and is not controlled with medicines.  You have abnormal vaginal bleeding with your period.  You develop nausea or vomiting with your period that is not controlled with medicine. SEEK IMMEDIATE MEDICAL CARE IF:  You pass out.    This information is not intended to replace advice given to you by your health care provider. Make sure you discuss any questions you have with your health care provider.   Document Released: 09/17/2005 Document Revised: 05/20/2013 Document Reviewed: 03/05/2013 Elsevier Interactive Patient Education [employer]. Return in 2 weeks for GYN [place] Then after that pap and physical

## 2016-04-13 NOTE — Progress Notes (Signed)
Subjective:     Patient ID: LANEY BOEKER, female   DOB: 1981/01/09, 35 y.o.   MRN: [id number]  HPI Ursela is a 35 year old white female in complaining of pain and nausea and vomiting with her periods for about 6 months now, is still on junel 1/20, and has PMS again and thinks she needs prozac again.She says she legs hurt esp the right and she is requesting pain meds.She asked for hydrocodone 10 mg. She is working 12 hours at [employer] and has missed work with her periods.  Review of Systems Patient denies any headaches, hearing loss, fatigue, blurred vision, shortness of breath, chest pain,  problems with bowel movements, urination, or intercourse. No joint pain, see HPI for positives. Reviewed past medical,surgical, social and family history. Reviewed medications and allergies.     Objective:   Physical Exam BP 108/78 mmHg  Pulse 80  Ht 5\' 4"  (1.626 m)  Wt 152 lb 8 oz (69.174 kg)  BMI 26.16 kg/m2  LMP 04/06/2016 Skin warm and dry.Pelvic: external genitalia is normal in appearance no lesions, vagina: white discharge without odor,urethra has no lesions or masses noted, cervix:smooth and bulbous, uterus: normal size, shape and contour, non tender, no masses felt, adnexa: no masses or tenderness noted. Bladder is non tender and no masses felt. GC/CHL obtained.    Discussed will try anaprox, and prozac and then get [place], and may will discuss more after [place]. Face time 15 minutes with 50% counseling.  Assessment:     Dysmenorrhea  PMS Nausea and vomiting, with periods    Plan:    GC/CHL sent   Rx prozac 20 mg #30 take 1 daily with 3 refills  Rx anaprox ds #30 take bid with 3 refills Return in 2 weeks for gyn [place] and then for pap and physical  Review handout on dysmenorrhea

## 2016-04-16 LAB — GC/CHLAMYDIA PROBE AMP
CHLAMYDIA, DNA PROBE: NEGATIVE
NEISSERIA GONORRHOEAE BY PCR: NEGATIVE

## 2016-04-24 ENCOUNTER — Telehealth: Payer: Self-pay | Admitting: Adult Health

## 2016-04-24 ENCOUNTER — Encounter: Payer: Self-pay | Admitting: Adult Health

## 2016-04-24 ENCOUNTER — Ambulatory Visit (INDEPENDENT_AMBULATORY_CARE_PROVIDER_SITE_OTHER): Payer: BLUE CROSS/BLUE SHIELD

## 2016-04-24 DIAGNOSIS — D219 Benign neoplasm of connective and other soft tissue, unspecified: Secondary | ICD-10-CM

## 2016-04-24 DIAGNOSIS — D259 Leiomyoma of uterus, unspecified: Secondary | ICD-10-CM

## 2016-04-24 DIAGNOSIS — N946 Dysmenorrhea, unspecified: Secondary | ICD-10-CM

## 2016-04-24 HISTORY — DX: Benign neoplasm of connective and other soft tissue, unspecified: D21.9

## 2016-04-24 NOTE — Telephone Encounter (Signed)
Pt aware US showed fibroid, 3 x 2.6 x 3.5 cm, keep appt for pap and physical can discuss more then

## 2016-04-24 NOTE — Progress Notes (Signed)
P/TV US today. UT contains right sided fibroid close to the LUS 3.0x2.6x3.5cm. RT OV WNL, positive mobility, and no pain. LT OV WNL, positive mobility, and no pain. Endo stripe 3 mm. No free fluid seen.

## 2016-05-02 ENCOUNTER — Other Ambulatory Visit (HOSPITAL_COMMUNITY)
Admission: RE | Admit: 2016-05-02 | Discharge: 2016-05-02 | Disposition: A | Discharge status: Home / Self Care | Payer: BLUE CROSS/BLUE SHIELD | Source: Ambulatory Visit | Attending: Adult Health | Admitting: Adult Health

## 2016-05-02 ENCOUNTER — Encounter: Payer: Self-pay | Admitting: Adult Health

## 2016-05-02 ENCOUNTER — Ambulatory Visit (INDEPENDENT_AMBULATORY_CARE_PROVIDER_SITE_OTHER): Payer: BLUE CROSS/BLUE SHIELD | Admitting: Adult Health

## 2016-05-02 VITALS — BP 110/60 | HR 80 | Ht 64.0 in | Wt 154.5 lb

## 2016-05-02 DIAGNOSIS — D259 Leiomyoma of uterus, unspecified: Secondary | ICD-10-CM | POA: Diagnosis not present

## 2016-05-02 DIAGNOSIS — Z8619 Personal history of other infectious and parasitic diseases: Secondary | ICD-10-CM

## 2016-05-02 DIAGNOSIS — Z01411 Encounter for gynecological examination (general) (routine) with abnormal findings: Secondary | ICD-10-CM

## 2016-05-02 DIAGNOSIS — Z01419 Encounter for gynecological examination (general) (routine) without abnormal findings: Secondary | ICD-10-CM

## 2016-05-02 DIAGNOSIS — Z113 Encounter for screening for infections with a predominantly sexual mode of transmission: Secondary | ICD-10-CM | POA: Insufficient documentation

## 2016-05-02 DIAGNOSIS — N946 Dysmenorrhea, unspecified: Secondary | ICD-10-CM | POA: Diagnosis not present

## 2016-05-02 DIAGNOSIS — Z1151 Encounter for screening for human papillomavirus (HPV): Secondary | ICD-10-CM | POA: Insufficient documentation

## 2016-05-02 DIAGNOSIS — R102 Pelvic and perineal pain: Secondary | ICD-10-CM | POA: Insufficient documentation

## 2016-05-02 HISTORY — DX: Pelvic and perineal pain: R10.2

## 2016-05-02 MED ORDER — PROMETHAZINE HCL 25 MG PO TABS: 25.0000 mg | ORAL_TABLET | Freq: Four times a day (QID) | ORAL | 1 refills | Status: DC | PRN

## 2016-05-02 MED ORDER — NORETHIN-ETH ESTRAD-FE BIPHAS 1 MG-10 MCG / 10 MCG PO TABS: 1.0000 | ORAL_TABLET | Freq: Every day | ORAL | 4 refills | Status: DC

## 2016-05-02 NOTE — Patient Instructions (Addendum)
Dysmenorrhea Menstrual cramps (dysmenorrhea) are caused by the muscles of the uterus tightening (contracting) during a menstrual period. For some women, this discomfort is merely bothersome. For others, dysmenorrhea can be severe enough to interfere with everyday activities for a few days each month. Primary dysmenorrhea is menstrual cramps that last a couple of days when you start having menstrual periods or soon after. This often begins after a teenager starts having her period. As a woman gets older or has a baby, the cramps will usually lessen or disappear. Secondary dysmenorrhea begins later in life, lasts longer, and the pain may be stronger than primary dysmenorrhea. The pain may start before the period and last a few days after the period.  CAUSES  Dysmenorrhea is usually caused by an underlying problem, such as:  The tissue lining the uterus grows outside of the uterus in other areas of the body (endometriosis).  The endometrial tissue, which normally lines the uterus, is found in or grows into the muscular walls of the uterus (adenomyosis).  The pelvic blood vessels are engorged with blood just before the menstrual period (pelvic congestive syndrome).  Overgrowth of cells (polyps) in the lining of the uterus or cervix.  Falling down of the uterus (prolapse) because of loose or stretched ligaments.  Depression.  Bladder problems, infection, or inflammation.  Problems with the intestine, a tumor, or irritable bowel syndrome.  Cancer of the female organs or bladder.  A severely tipped uterus.  A very tight opening or closed cervix.  Noncancerous tumors of the uterus (fibroids).  Pelvic inflammatory disease (PID).  Pelvic scarring (adhesions) from a previous surgery.  Ovarian cyst.  An intrauterine device (IUD) used for birth control. RISK FACTORS You may be at greater risk of dysmenorrhea if:  You are younger than age 62.  You started puberty early.  You have  irregular or heavy bleeding.  You have never given birth.  You have a family history of this problem.  You are a smoker. SIGNS AND SYMPTOMS   Cramping or throbbing pain in your lower abdomen.  Headaches.  Lower back pain.  Nausea or vomiting.  Diarrhea.  Sweating or dizziness.  Loose stools. DIAGNOSIS  A diagnosis is based on your history, symptoms, physical exam, diagnostic tests, or procedures. Diagnostic tests or procedures may include:  Blood tests.  Ultrasonography.  An examination of the lining of the uterus (dilation and curettage, D&C).  An examination inside your abdomen or pelvis with a scope (laparoscopy).  X-rays.  CT scan.  MRI.  An examination inside the bladder with a scope (cystoscopy).  An examination inside the intestine or stomach with a scope (colonoscopy, gastroscopy). TREATMENT  Treatment depends on the cause of the dysmenorrhea. Treatment may include:  Pain medicine prescribed by your health care provider.  Birth control pills or an IUD with progesterone hormone in it.  Hormone replacement therapy.  Nonsteroidal anti-inflammatory drugs (NSAIDs). These may help stop the production of prostaglandins.  Surgery to remove adhesions, endometriosis, ovarian cyst, or fibroids.  Removal of the uterus (hysterectomy).  Progesterone shots to stop the menstrual period.  Cutting the nerves on the sacrum that go to the female organs (presacral neurectomy).  Electric current to the sacral nerves (sacral nerve stimulation).  Antidepressant medicine.  Psychiatric therapy, counseling, or group therapy.  Exercise and physical therapy.  Meditation and yoga therapy.  Acupuncture. HOME CARE INSTRUCTIONS   Only take over-the-counter or prescription medicines as directed by your health care provider.  Place a heating pad  or hot water bottle on your lower back or abdomen. Do not sleep with the heating pad.  Use aerobic exercises, walking,  swimming, biking, and other exercises to help lessen the cramping.  Massage to the lower back or abdomen may help.  Stop smoking.  Avoid alcohol and caffeine. SEEK MEDICAL CARE IF:   Your pain does not get better with medicine.  You have pain with sexual intercourse.  Your pain increases and is not controlled with medicines.  You have abnormal vaginal bleeding with your period.  You develop nausea or vomiting with your period that is not controlled with medicine. SEEK IMMEDIATE MEDICAL CARE IF:  You pass out.    This information is not intended to replace advice given to you by your health care provider. Make sure you discuss any questions you have with your health care provider.   Document Released: 09/17/2005 Document Revised: 05/20/2013 Document Reviewed: 03/05/2013 Elsevier Interactive Patient Education 2016 Elsevier Inc. Pelvic Pain, Female Female pelvic pain can be caused by many different things and start from a variety of places. Pelvic pain refers to pain that is located in the lower half of the abdomen and between your hips. The pain may occur over a short period of time (acute) or may be reoccurring (chronic). The cause of pelvic pain may be related to disorders affecting the female reproductive organs (gynecologic), but it may also be related to the bladder, kidney stones, an intestinal complication, or muscle or skeletal problems. Getting help right away for pelvic pain is important, especially if there has been severe, sharp, or a sudden onset of unusual pain. It is also important to get help right away because some types of pelvic pain can be life threatening.  CAUSES  Below are only some of the causes of pelvic pain. The causes of pelvic pain can be in one of several categories.   Gynecologic.  Pelvic inflammatory disease.  Sexually transmitted infection.  Ovarian cyst or a twisted ovarian ligament (ovarian torsion).  Uterine lining that grows outside the uterus  (endometriosis).  Fibroids, cysts, or tumors.  Ovulation.  Pregnancy.  Pregnancy that occurs outside the uterus (ectopic pregnancy).  Miscarriage.  Labor.  Abruption of the placenta or ruptured uterus.  Infection.  Uterine infection (endometritis).  Bladder infection.  Diverticulitis.  Miscarriage related to a uterine infection (septic abortion).  Bladder.  Inflammation of the bladder (cystitis).  Kidney stone(s).  Gastrointestinal.  Constipation.  Diverticulitis.  Neurologic.  Trauma.  Feeling pelvic pain because of mental or emotional causes (psychosomatic).  Cancers of the bowel or pelvis. EVALUATION  Your caregiver will want to take a careful history of your concerns. This includes recent changes in your health, a careful gynecologic history of your periods (menses), and a sexual history. Obtaining your family history and medical history is also important. Your caregiver may suggest a pelvic exam. A pelvic exam will help identify the location and severity of the pain. It also helps in the evaluation of which organ system may be involved. In order to identify the cause of the pelvic pain and be properly treated, your caregiver may order tests. These tests may include:   A pregnancy test.  Pelvic ultrasonography.  An X-ray exam of the abdomen.  A urinalysis or evaluation of vaginal discharge.  Blood tests. HOME CARE INSTRUCTIONS   Only take over-the-counter or prescription medicines for pain, discomfort, or fever as directed by your caregiver.   Rest as directed by your caregiver.   Eat a  balanced diet.   Drink enough fluids to make your urine clear or pale yellow, or as directed.   Avoid sexual intercourse if it causes pain.   Apply warm or cold compresses to the lower abdomen depending on which one helps the pain.   Avoid stressful situations.   Keep a journal of your pelvic pain. Write down when it started, where the pain is  located, and if there are things that seem to be associated with the pain, such as food or your menstrual cycle.  Follow up with your caregiver as directed.  SEEK MEDICAL CARE IF:  Your medicine does not help your pain.  You have abnormal vaginal discharge. SEEK IMMEDIATE MEDICAL CARE IF:   You have heavy bleeding from the vagina.   Your pelvic pain increases.   You feel light-headed or faint.   You have chills.   You have pain with urination or blood in your urine.   You have uncontrolled diarrhea or vomiting.   You have a fever or persistent symptoms for more than 3 days.  You have a fever and your symptoms suddenly get worse.   You are being physically or sexually abused.   This information is not intended to replace advice given to you by your health care provider. Make sure you discuss any questions you have with your health care provider.   Document Released: 08/14/2004 Document Revised: 06/08/2015 Document Reviewed: 01/07/2012 Elsevier Interactive Patient Education 2016 Elsevier Inc. Uterine Fibroids Uterine fibroids are tissue masses (tumors) that can develop in the womb (uterus). They are also called leiomyomas. This type of tumor is not cancerous (benign) and does not spread to other parts of the body outside of the pelvic area, which is between the hip bones. Occasionally, fibroids may develop in the fallopian tubes, in the cervix, or on the support structures (ligaments) that surround the uterus. You can have one or many fibroids. Fibroids can vary in size, weight, and where they grow in the uterus. Some can become quite large. Most fibroids do not require medical treatment. CAUSES A fibroid can develop when a single uterine cell keeps growing (replicating). Most cells in the human body have a control mechanism that keeps them from replicating without control. SIGNS AND SYMPTOMS Symptoms may include:   Heavy bleeding during your period.  Bleeding or  spotting between periods.  Pelvic pain and pressure.  Bladder problems, such as needing to urinate more often (urinary frequency) or urgently.  Inability to reproduce offspring (infertility).  Miscarriages. DIAGNOSIS Uterine fibroids are diagnosed through a physical exam. Your health care provider may feel the lumpy tumors during a pelvic exam. Ultrasonography and an MRI may be done to determine the size, location, and number of fibroids. TREATMENT Treatment may include:  Watchful waiting. This involves getting the fibroid checked by your health care provider to see if it grows or shrinks. Follow your health care provider's recommendations for how often to have this checked.  Hormone medicines. These can be taken by mouth or given through an intrauterine device (IUD).  Surgery.  Removing the fibroids (myomectomy) or the uterus (hysterectomy).  Removing blood supply to the fibroids (uterine artery embolization). If fibroids interfere with your fertility and you want to become pregnant, your health care provider may recommend having the fibroids removed.  HOME CARE INSTRUCTIONS  Keep all follow-up visits as directed by your health care provider. This is important.  Take medicines only as directed by your health care provider.  If you were  prescribed a hormone treatment, take the hormone medicines exactly as directed.  Do not take aspirin, because it can cause bleeding.  Ask your health care provider about taking iron pills and increasing the amount of dark green, leafy vegetables in your diet. These actions can help to boost your blood iron levels, which may be affected by heavy menstrual bleeding.  Pay close attention to your period and tell your health care provider about any changes, such as:  Increased blood flow that requires you to use more pads or tampons than usual per month.  A change in the number of days that your period lasts per month.  A change in symptoms that  are associated with your period, such as abdominal cramping or back pain. SEEK MEDICAL CARE IF:  You have pelvic pain, back pain, or abdominal cramps that cannot be controlled with medicines.  You have an increase in bleeding between and during periods.  You soak tampons or pads in a half hour or less.  You feel lightheaded, extra tired, or weak. SEEK IMMEDIATE MEDICAL CARE IF:  You faint.  You have a sudden increase in pelvic pain.   This information is not intended to replace advice given to you by your health care provider. Make sure you discuss any questions you have with your health care provider.   Document Released: 09/14/2000 Document Revised: 10/08/2014 Document Reviewed: 03/16/2014 Elsevier Interactive Patient Education 2016 [place] lo loestrin Follow up in 3 months

## 2016-05-02 NOTE — Progress Notes (Signed)
Patient ID: Lori Webster, female   DOB: 08-17-1981, 35 y.o.   MRN: [id number] History of Present Illness: Lori Webster is a 35 year old white female in for a well woman gyn exam and pap, she has pelvic pain(cramps in pelvic area and back) and pain with her periods.She had [place] 03/2516 that showed 3 x 2.6 x 3.5 cm right sided fibroid. Last pap 06/17/14 normal with +HPV.  Current Medications, Allergies, Past Medical History, Past Surgical History, Family History and Social History were reviewed in [facility] record.     Review of Systems: Patient denies any headaches, hearing loss, fatigue, blurred vision, shortness of breath, chest pain,  problems with bowel movements, urination, or intercourse. No joint pain or mood swings. See HPI for positives.   Physical Exam:BP 110/60 (BP Location: Left Arm, Patient Position: Sitting, Cuff Size: Normal)   Pulse 80   Ht 5\' 4"  (1.626 m)   Wt 154 lb 8 oz (70.1 kg)   LMP 04/06/2016   BMI 26.52 kg/m  General:  Well developed, well nourished, no acute distress Skin:  Warm and dry Neck:  Midline trachea, normal thyroid, good ROM, no lymphadenopathy Lungs; Clear to auscultation bilaterally Breast:  No dominant palpable mass, retraction, or nipple discharge Cardiovascular: Regular rate and rhythm Abdomen:  Soft, non tender, no hepatosplenomegaly Pelvic:  External genitalia is normal in appearance, no lesions.  The vagina is normal in appearance. Urethra has no lesions or masses. The cervix is smooth, pap with HPV and GC/CHL performed.  Uterus is felt to be normal size, shape, and mildly tender.  No adnexal masses or tenderness noted.Bladder is non tender, no masses felt. Extremities/musculoskeletal:  No swelling or varicosities noted, no clubbing or cyanosis Psych:  No mood changes, alert and cooperative,seems happy Discussed changing  OCs and trying to stop smoking and also myomectomy. She has naprosyn but it makes her have nausea at times.She  does want children at some time.   Impression: Well woman gyn exam and pap Fibroid Dysmenorrhea Pelvic pain History of HPV    Plan: Rx lo loestrin take 1 daily disp 3 packs with 4 refills Rx phenergan 25 mg #30 take 1 every 6 hours prn N/V with 1 refill Review handout on fibroids,pelvic pain and dysmenorrhea Follow up in 3 months Physical in 1 year pap in 3 if normal Mammogram at 41

## 2016-05-04 LAB — CYTOLOGY - PAP

## 2016-05-22 ENCOUNTER — Telehealth: Payer: Self-pay | Admitting: Adult Health

## 2016-05-22 NOTE — Telephone Encounter (Signed)
Pt states she thinks she has UTI. Pt informed she needed an appt for evaluation. Pt states the only time she could come for appt was Friday. Appt scheduled for 05/25/2016 @ 11:30 pm.

## 2016-05-25 ENCOUNTER — Ambulatory Visit: Payer: Self-pay | Admitting: Adult Health

## 2016-06-21 ENCOUNTER — Emergency Department (HOSPITAL_COMMUNITY)
Admission: EM | Admit: 2016-06-21 | Discharge: 2016-06-21 | Disposition: A | Discharge status: Home / Self Care | Payer: BLUE CROSS/BLUE SHIELD | Attending: Emergency Medicine | Admitting: Emergency Medicine

## 2016-06-21 ENCOUNTER — Encounter (HOSPITAL_COMMUNITY): Payer: Self-pay | Admitting: Emergency Medicine

## 2016-06-21 ENCOUNTER — Emergency Department (HOSPITAL_COMMUNITY): Payer: BLUE CROSS/BLUE SHIELD

## 2016-06-21 DIAGNOSIS — E860 Dehydration: Secondary | ICD-10-CM | POA: Insufficient documentation

## 2016-06-21 DIAGNOSIS — R112 Nausea with vomiting, unspecified: Secondary | ICD-10-CM

## 2016-06-21 DIAGNOSIS — R109 Unspecified abdominal pain: Secondary | ICD-10-CM | POA: Insufficient documentation

## 2016-06-21 DIAGNOSIS — Z79899 Other long term (current) drug therapy: Secondary | ICD-10-CM | POA: Insufficient documentation

## 2016-06-21 DIAGNOSIS — R197 Diarrhea, unspecified: Secondary | ICD-10-CM | POA: Insufficient documentation

## 2016-06-21 DIAGNOSIS — J45909 Unspecified asthma, uncomplicated: Secondary | ICD-10-CM | POA: Insufficient documentation

## 2016-06-21 DIAGNOSIS — F17210 Nicotine dependence, cigarettes, uncomplicated: Secondary | ICD-10-CM | POA: Insufficient documentation

## 2016-06-21 LAB — URINE MICROSCOPIC-ADD ON: WBC UA: NONE SEEN WBC/hpf (ref 0–5)

## 2016-06-21 LAB — COMPREHENSIVE METABOLIC PANEL
ALT: 18 U/L (ref 14–54)
ANION GAP: 12 (ref 5–15)
AST: 33 U/L (ref 15–41)
Albumin: 4.1 g/dL (ref 3.5–5.0)
Alkaline Phosphatase: 49 U/L (ref 38–126)
BILIRUBIN TOTAL: 0.8 mg/dL (ref 0.3–1.2)
BUN: 8 mg/dL (ref 6–20)
CHLORIDE: 103 mmol/L (ref 101–111)
CO2: 18 mmol/L — ABNORMAL LOW (ref 22–32)
Calcium: 9.5 mg/dL (ref 8.9–10.3)
Creatinine, Ser: 0.87 mg/dL (ref 0.44–1.00)
Glucose, Bld: 142 mg/dL — ABNORMAL HIGH (ref 65–99)
POTASSIUM: 3.9 mmol/L (ref 3.5–5.1)
Sodium: 133 mmol/L — ABNORMAL LOW (ref 135–145)
TOTAL PROTEIN: 7.9 g/dL (ref 6.5–8.1)

## 2016-06-21 LAB — URINALYSIS, ROUTINE W REFLEX MICROSCOPIC
Bilirubin Urine: NEGATIVE
Glucose, UA: NEGATIVE mg/dL
KETONES UR: 15 mg/dL — AB
LEUKOCYTES UA: NEGATIVE
NITRITE: NEGATIVE
Protein, ur: 100 mg/dL — AB
Specific Gravity, Urine: 1.02 (ref 1.005–1.030)
pH: 7.5 (ref 5.0–8.0)

## 2016-06-21 LAB — CBC
HEMATOCRIT: 42.6 % (ref 36.0–46.0)
HEMOGLOBIN: 14.3 g/dL (ref 12.0–15.0)
MCH: 33 pg (ref 26.0–34.0)
MCHC: 33.6 g/dL (ref 30.0–36.0)
MCV: 98.4 fL (ref 78.0–100.0)
Platelets: 223 10*3/uL (ref 150–400)
RBC: 4.33 MIL/uL (ref 3.87–5.11)
RDW: 12.8 % (ref 11.5–15.5)
WBC: 16.6 10*3/uL — AB (ref 4.0–10.5)

## 2016-06-21 LAB — POC URINE PREG, ED: Preg Test, Ur: NEGATIVE

## 2016-06-21 LAB — LIPASE, BLOOD: LIPASE: 46 U/L (ref 11–51)

## 2016-06-21 MED ORDER — LOPERAMIDE HCL 2 MG PO CAPS
2.0000 mg | ORAL_CAPSULE | Freq: Once | ORAL | Status: AC
  Administered 2016-06-21: 2 mg via ORAL
  Filled 2016-06-21: qty 1

## 2016-06-21 MED ORDER — DICYCLOMINE HCL 10 MG/ML IM SOLN
20.0000 mg | Freq: Once | INTRAMUSCULAR | Status: AC
  Administered 2016-06-21: 20 mg via INTRAMUSCULAR
  Filled 2016-06-21: qty 2

## 2016-06-21 MED ORDER — ONDANSETRON HCL 4 MG/2ML IJ SOLN
4.0000 mg | Freq: Once | INTRAMUSCULAR | Status: DC | PRN
  Filled 2016-06-21 (×2): qty 2

## 2016-06-21 MED ORDER — DICYCLOMINE HCL 20 MG PO TABS: ORAL_TABLET | ORAL | 0 refills | Status: DC

## 2016-06-21 MED ORDER — SODIUM CHLORIDE 0.9 % IV BOLUS (SEPSIS)
1000.0000 mL | Freq: Once | INTRAVENOUS | Status: AC
  Administered 2016-06-21: 1000 mL via INTRAVENOUS

## 2016-06-21 MED ORDER — LOPERAMIDE HCL 2 MG PO CAPS
4.0000 mg | ORAL_CAPSULE | Freq: Once | ORAL | Status: AC
  Administered 2016-06-21: 4 mg via ORAL
  Filled 2016-06-21: qty 2

## 2016-06-21 MED ORDER — METOCLOPRAMIDE HCL 5 MG/ML IJ SOLN
10.0000 mg | Freq: Once | INTRAMUSCULAR | Status: AC
  Administered 2016-06-21: 10 mg via INTRAVENOUS
  Filled 2016-06-21: qty 2

## 2016-06-21 MED ORDER — ALBUTEROL SULFATE (2.5 MG/3ML) 0.083% IN NEBU
5.0000 mg | INHALATION_SOLUTION | Freq: Once | RESPIRATORY_TRACT | Status: AC
  Administered 2016-06-21: 5 mg via RESPIRATORY_TRACT
  Filled 2016-06-21: qty 6

## 2016-06-21 MED ORDER — FENTANYL CITRATE (PF) 100 MCG/2ML IJ SOLN
50.0000 ug | Freq: Once | INTRAMUSCULAR | Status: AC
Start: 2016-06-21 — End: 2016-06-21
  Administered 2016-06-21: 50 ug via INTRAVENOUS
  Filled 2016-06-21: qty 2

## 2016-06-21 MED ORDER — DIPHENHYDRAMINE HCL 50 MG/ML IJ SOLN
25.0000 mg | Freq: Once | INTRAMUSCULAR | Status: AC
  Administered 2016-06-21: 25 mg via INTRAVENOUS
  Filled 2016-06-21: qty 1

## 2016-06-21 MED ORDER — ONDANSETRON HCL 4 MG PO TABS: 4.0000 mg | ORAL_TABLET | Freq: Three times a day (TID) | ORAL | 0 refills | Status: DC | PRN

## 2016-06-21 NOTE — ED Provider Notes (Signed)
[facility] DEPT Provider Note   CSN: [id number] Arrival date & time: 06/21/16  0443  Time seen 04:52 AM   History   Chief Complaint Chief Complaint  Patient presents with  . Emesis    HPI Lori Webster is a 35 y.o. female.  HPI patient reports she started getting vomiting and diarrhea about 6 days ago. She states it has been occurring off and on since then however yesterday she started having vomiting about every hour and diarrhea about every 20-30 minutes that she describes as watery. She has some constant abdominal pain however she states right before she needs have diarrhea the cramping pain gets worse. She complains of feeling weak, dizzy, and lightheaded especially on standing. She denies having any fever. She has not been on antibiotics recently. She denies being around anyone else who is ill or eating something she thinks made her sick.  Patient also has a history of reactive air disease and states she has been wheezing and having a cough that is sometimes productive of clear mucus.   PCP [facility] in [place]  Past Medical History:  Diagnosis Date  . Anxiety   . Asthma   . Dysmenorrhea 04/15/2014  . Eczema   . Fibroid 04/24/2016  . Menstrual extraction 04/15/2014  . Nausea & vomiting 04/13/2016  . Pelvic pain in female 05/02/2016  . PMDD (premenstrual dysphoric disorder)   . PMS (premenstrual syndrome) 04/13/2016  . 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 Uterine fibroid   . Vaginal discharge 04/15/2014  . Yeast infection 04/15/2014    Patient Active Problem List   Diagnosis Date Noted  . Pelvic pain in female 05/02/2016  . Fibroid 04/24/2016  . PMS (premenstrual syndrome) 04/13/2016  . Nausea & vomiting 04/13/2016  . 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 Menorrhagia 04/15/2014  . Dysmenorrhea 04/15/2014  . Vaginal discharge 04/15/2014  . Yeast infection 04/15/2014  . Menstrual extraction 04/15/2014  . GERD  05/01/2010  . IBS 05/01/2010  . CHOLELITHIASIS 05/01/2010  . ABDOMINAL PAIN, EPIGASTRIC 05/01/2010  . TRANSAMINASES, SERUM, ELEVATED 05/01/2010    Past Surgical History:  Procedure Laterality Date  . CHOLECYSTECTOMY      OB History    Gravida Para Term Preterm AB Living   0             SAB TAB Ectopic Multiple Live Births                   Home Medications    Prior to Admission medications   Medication Sig Start Date End Date Taking? Authorizing Provider  albuterol (PROAIR HFA) 108 (90 BASE) MCG/ACT inhaler Inhale 2 puffs into the lungs every 6 (six) hours as needed for wheezing or shortness of breath.    Historical Provider, MD  albuterol (PROVENTIL) (2.5 MG/3ML) 0.083% nebulizer solution Take 2.5 mg by nebulization every 6 (six) hours as needed for wheezing or shortness of breath.    Historical Provider, MD  albuterol (PROVENTIL) 4 MG tablet Take 4 mg by mouth as needed.     Historical Provider, MD  cetirizine (ZYRTEC) 10 MG tablet Take 10 mg by mouth as needed for allergies.    Historical Provider, MD  dicyclomine (BENTYL) 20 MG tablet 4 times daily prn abdominal cramping 06/21/16   Rolland Porter, MD  FLUoxetine (PROZAC) 20 MG tablet Take 1 tablet (20 mg total) by mouth daily. 04/13/16   Estill Dooms, NP  fluticasone (FLONASE) 50 MCG/ACT  nasal spray Place 2 sprays into both nostrils as needed for allergies or rhinitis.    Historical Provider, MD  montelukast (SINGULAIR) 10 MG tablet Take 10 mg by mouth at bedtime.    Historical Provider, MD  Norethindrone-Ethinyl Estradiol-Fe Biphas (LO LOESTRIN FE) 1 MG-10 MCG / 10 MCG tablet Take 1 tablet by mouth daily. Take 1 daily by mouth 05/02/16   Estill Dooms, NP  ondansetron (ZOFRAN) 4 MG tablet Take 1 tablet (4 mg total) by mouth every 8 (eight) hours as needed for nausea or vomiting. 06/21/16   Rolland Porter, MD  promethazine (PHENERGAN) 25 MG tablet Take 1 tablet (25 mg total) by mouth every 6 (six) hours as needed for nausea or  vomiting. 05/02/16   Estill Dooms, NP  triamcinolone cream (KENALOG) 0.1 % Apply 1 application topically as needed.     Historical Provider, MD    Family History Family History  Problem Relation Age of Onset  . Asthma Father   . Depression Father   . Depression Maternal Grandmother   . Other Maternal Grandmother     eye issues  . Dementia Maternal Grandfather   . Cancer Maternal Grandfather     skin  . Diabetes Maternal Grandfather   . Alzheimer's disease Maternal Grandfather   . Cancer Paternal Grandfather     lung  . Allergies Mother   . Other Mother     chronic pain; legally blind    Social History Social History  Substance Use Topics  . Smoking status: Current Every Day Smoker    Packs/day: 0.00    Years: 20.00    Types: Cigarettes  . Smokeless tobacco: Never Used     Comment: smokes 1 cig daily  . Alcohol use Yes     Comment: occ  unemployed Denies smoking   Allergies   Peanut-containing drug products   Review of Systems Review of Systems  All other systems reviewed and are negative.    Physical Exam Updated Vital Signs BP 137/80 (BP Location: Left Arm)   Pulse 76   Temp 98.9 F (37.2 C) (Oral)   Resp 20   SpO2 97%   Vital signs normal    Physical Exam  Constitutional: She is oriented to person, place, and time. She appears well-developed and well-nourished.  Non-toxic appearance. She does not appear ill. No distress.  HENT:  Head: Normocephalic and atraumatic.  Right Ear: External ear normal.  Left Ear: External ear normal.  Nose: Nose normal. No mucosal edema or rhinorrhea.  Mouth/Throat: Mucous membranes are normal. No dental abscesses or uvula swelling.  Tongue dry  Eyes: Conjunctivae and EOM are normal. Pupils are equal, round, and reactive to light.  Neck: Normal range of motion and full passive range of motion without pain. Neck supple.  Cardiovascular: Normal rate, regular rhythm and normal heart sounds.  Exam reveals no gallop  and no friction rub.   No murmur heard. Pulmonary/Chest: Effort normal. No respiratory distress. She has no wheezes. She has rhonchi. She has no rales. She exhibits no tenderness and no crepitus.  Abdominal: Soft. Normal appearance and bowel sounds are normal. She exhibits no distension. There is tenderness. There is no rebound and no guarding.  She has some mild diffuse tenderness of her right abdomen  Musculoskeletal: Normal range of motion. She exhibits no edema or tenderness.  Moves all extremities well.   Neurological: She is alert and oriented to person, place, and time. She has normal strength. No cranial nerve  deficit.  Skin: Skin is warm, dry and intact. No rash noted. No erythema. No pallor.  Psychiatric: She has a normal mood and affect. Her speech is normal and behavior is normal. Her mood appears not anxious.  Nursing note and vitals reviewed.    ED Treatments / Results  Labs (all labs ordered are listed, but only abnormal results are displayed) Results for orders placed or performed during the hospital encounter of 06/21/16  Lipase, blood  Result Value Ref Range   Lipase 46 11 - 51 U/L  Comprehensive metabolic panel  Result Value Ref Range   Sodium 133 (L) 135 - 145 mmol/L   Potassium 3.9 3.5 - 5.1 mmol/L   Chloride 103 101 - 111 mmol/L   CO2 18 (L) 22 - 32 mmol/L   Glucose, Bld 142 (H) 65 - 99 mg/dL   BUN 8 6 - 20 mg/dL   Creatinine, Ser 0.87 0.44 - 1.00 mg/dL   Calcium 9.5 8.9 - 10.3 mg/dL   Total Protein 7.9 6.5 - 8.1 g/dL   Albumin 4.1 3.5 - 5.0 g/dL   AST 33 15 - 41 U/L   ALT 18 14 - 54 U/L   Alkaline Phosphatase 49 38 - 126 U/L   Total Bilirubin 0.8 0.3 - 1.2 mg/dL   GFR calc non Af Amer >60 >60 mL/min   GFR calc Af Amer >60 >60 mL/min   Anion gap 12 5 - 15  CBC  Result Value Ref Range   WBC 16.6 (H) 4.0 - 10.5 K/uL   RBC 4.33 3.87 - 5.11 MIL/uL   Hemoglobin 14.3 12.0 - 15.0 g/dL   HCT 42.6 36.0 - 46.0 %   MCV 98.4 78.0 - 100.0 fL   MCH 33.0 26.0 -  34.0 pg   MCHC 33.6 30.0 - 36.0 g/dL   RDW 12.8 11.5 - 15.5 %   Platelets 223 150 - 400 K/uL  Urinalysis, Routine w reflex microscopic  Result Value Ref Range   Color, Urine YELLOW YELLOW   APPearance CLEAR CLEAR   Specific Gravity, Urine 1.020 1.005 - 1.030   pH 7.5 5.0 - 8.0   Glucose, UA NEGATIVE NEGATIVE mg/dL   Hgb urine dipstick MODERATE (A) NEGATIVE   Bilirubin Urine NEGATIVE NEGATIVE   Ketones, ur 15 (A) NEGATIVE mg/dL   Protein, ur 100 (A) NEGATIVE mg/dL   Nitrite NEGATIVE NEGATIVE   Leukocytes, UA NEGATIVE NEGATIVE  Urine microscopic-add on  Result Value Ref Range   Squamous Epithelial / LPF 0-5 (A) NONE SEEN   WBC, UA NONE SEEN 0 - 5 WBC/hpf   RBC / HPF TOO NUMEROUS TO COUNT 0 - 5 RBC/hpf   Bacteria, UA MANY (A) NONE SEEN  POC urine preg, ED  Result Value Ref Range   Preg Test, Ur NEGATIVE NEGATIVE   Laboratory interpretation all normal except leukocytosis, hematuria without crystals (states she had spotting last week, no regular menses while on BCP's)   Radiology Ct Abdomen Pelvis Wo Contrast  Result Date: 06/21/2016 CLINICAL DATA:  Vomiting over the past 24 hours. Right lower back pain, bilateral leg and lower abdominal pain. Nausea, vomiting, and diarrhea since Sunday. EXAM: CT ABDOMEN AND PELVIS WITHOUT CONTRAST TECHNIQUE: Multidetector CT imaging of the abdomen and pelvis was performed following the standard protocol without IV contrast. COMPARISON:  None. FINDINGS: Lower chest: Motion artifact.  Lung bases are clear. Hepatobiliary: Surgical absence of the gallbladder. No bile duct dilatation. Unenhanced appearance of the liver is unremarkable. Pancreas: Unenhanced appearance  is unremarkable. Spleen: Unenhanced appearance is unremarkable. Adrenals/Urinary Tract: No adrenal gland nodules. Sub cm hyperdense lesion in the upper pole left kidney probably represents a hemorrhagic cyst. Small stones demonstrated in the mid and lower pole of the left kidney. No right renal  stones. No hydronephrosis or hydroureter. No ureteral or bladder stones. Bladder is decompressed. Stomach/Bowel: Stomach, small bowel, and colon are mostly decompressed. Appendix is normal. Vascular/Lymphatic: Normal caliber abdominal aorta. No significant lymphadenopathy. Reproductive: Uterus and ovaries are not enlarged. Other: Abdominal wall musculature appears intact. No free air or free fluid. Musculoskeletal: No acute or significant osseous findings. IMPRESSION: Nonobstructing intrarenal stones on the left kidney. Right kidney and ureter appear normal. No evidence of bowel obstruction or inflammation. Appendix is normal. Electronically Signed   By: Lucienne Capers M.D.   On: 06/21/2016 06:56    Procedures Procedures (including critical care time)  Medications Ordered in ED Medications  ondansetron (ZOFRAN) injection 4 mg (not administered)  sodium chloride 0.9 % bolus 1,000 mL (0 mLs Intravenous Stopped 06/21/16 0720)  sodium chloride 0.9 % bolus 1,000 mL (0 mLs Intravenous Stopped 06/21/16 0720)  loperamide (IMODIUM) capsule 4 mg (4 mg Oral Given 06/21/16 0519)  metoCLOPramide (REGLAN) injection 10 mg (10 mg Intravenous Given 06/21/16 0519)  diphenhydrAMINE (BENADRYL) injection 25 mg (25 mg Intravenous Given 06/21/16 0519)  albuterol (PROVENTIL) (2.5 MG/3ML) 0.083% nebulizer solution 5 mg (5 mg Nebulization Given 06/21/16 0537)  sodium chloride 0.9 % bolus 1,000 mL (1,000 mLs Intravenous New Bag/Given 06/21/16 0720)  fentaNYL (SUBLIMAZE) injection 50 mcg (50 mcg Intravenous Given 06/21/16 0628)  dicyclomine (BENTYL) injection 20 mg (20 mg Intramuscular Given 06/21/16 0718)  loperamide (IMODIUM) capsule 2 mg (2 mg Oral Given 06/21/16 [id number])     Initial Impression / Assessment and Plan / ED Course  I have reviewed the triage vital signs and the nursing notes.  Pertinent labs & imaging results that were available during my care of the patient were reviewed by me and considered in my medical  decision making (see chart for details).  Clinical Course   PT was given IV fluids, IV nausea and oral diarrhea medications.   Recheck 06:00 AM states she is still having a lot of right flank pain. Doesn't feel like she can tolerate oral fluids so will proceed with non-contrast AP CT scan  Recheck 07:00 AM discussed her CT results. She is still having some pain, Bentyl ordered. No urine output yet reported by patient but did give a UA, and  is willing to try oral fluids. Reports still having episodes of diarrhea. More imodium written.   Recheck 07:55 am reports she is ready to be discharged. She has almost finished her IV fluids.   Final Clinical Impressions(s) / ED Diagnoses   Final diagnoses:  Nausea vomiting and diarrhea  Acute right flank pain  Dehydration    New Prescriptions New Prescriptions   DICYCLOMINE (BENTYL) 20 MG TABLET    4 times daily prn abdominal cramping   ONDANSETRON (ZOFRAN) 4 MG TABLET    Take 1 tablet (4 mg total) by mouth every 8 (eight) hours as needed for nausea or vomiting.  OTC imodium  Plan discharge  Rolland Porter, MD, Barbette Or, MD 06/21/16 [phone]

## 2016-06-21 NOTE — ED Triage Notes (Signed)
Reports n/v and diarrhea since Sunday.  Reports vomiting approx 15x in past 24hours.  Pain to rt lower back , bilateral legs and lower abd.

## 2016-06-21 NOTE — Discharge Instructions (Signed)
Drink plenty of fluids (clear liquids) this morning and if doing well this evening start a bland diet such as toast, crackers, jello, [employer] chicken noodle soup. Use the zofran for nausea or vomiting. Take imodium OTC for diarrhea. Avoid mild products until the diarrhea is gone. Use the dicyclomine for abdominal cramping. Recheck if you get worse again.

## 2016-06-21 NOTE — ED Notes (Signed)
Pt drank cup of water without difficulty.  No vomiting.  States her abdominal cramping is better.  States she is ready to go home.

## 2016-08-03 ENCOUNTER — Ambulatory Visit: Payer: BLUE CROSS/BLUE SHIELD | Admitting: Adult Health

## 2016-08-19 ENCOUNTER — Other Ambulatory Visit: Payer: Self-pay | Admitting: Adult Health

## 2017-02-11 ENCOUNTER — Ambulatory Visit: Payer: BLUE CROSS/BLUE SHIELD | Admitting: Adult Health

## 2018-06-14 IMAGING — CT CT ABD-PELV W/O CM
2 of 4 series · 16 of 46 positions shown, 18 images · non-contrast
Comparison: None.

CLINICAL DATA: Vomiting over the past 24 hours. Right lower back
pain, bilateral leg and lower abdominal pain. Nausea, vomiting, and
diarrhea since [REDACTED].

EXAM:
CT ABDOMEN AND PELVIS WITHOUT CONTRAST
TECHNIQUE: Multidetector CT imaging of the abdomen and pelvis was performed
following the standard protocol without IV contrast.

[Series 2: axial st · axial · 0.71mm/px · z∈[-936,-521]mm · 13 of 93 slices shown, 15 images]
[im 5/93  soft-tissue]
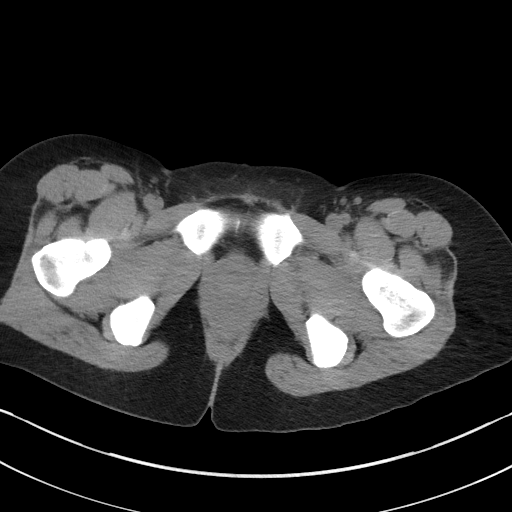
[im 5/93  bone]
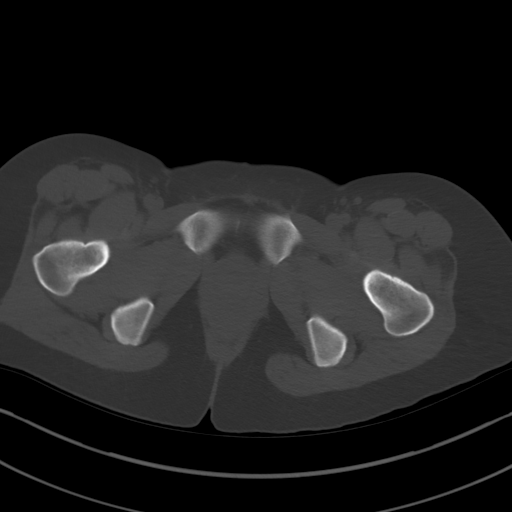
[im 13/93  soft-tissue]
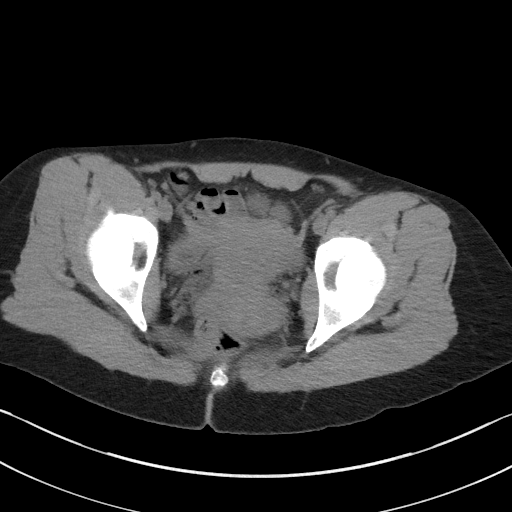
[im 21/93  soft-tissue]
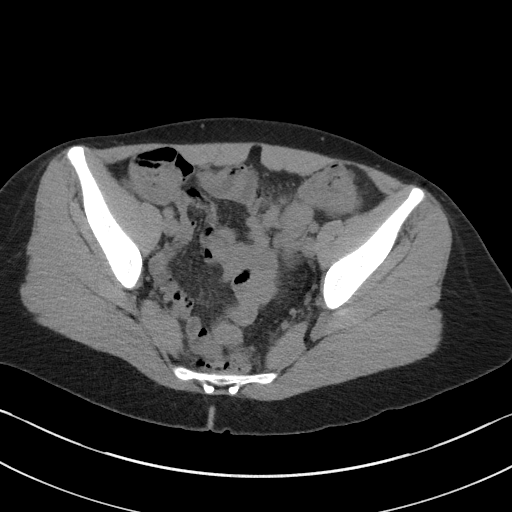
[im 26/93  soft-tissue]
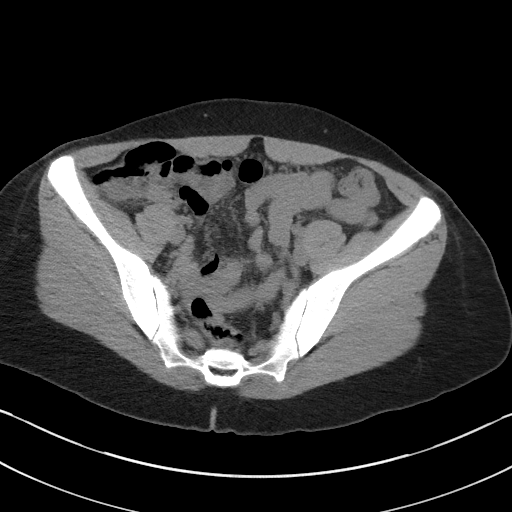
[im 34/93  soft-tissue]
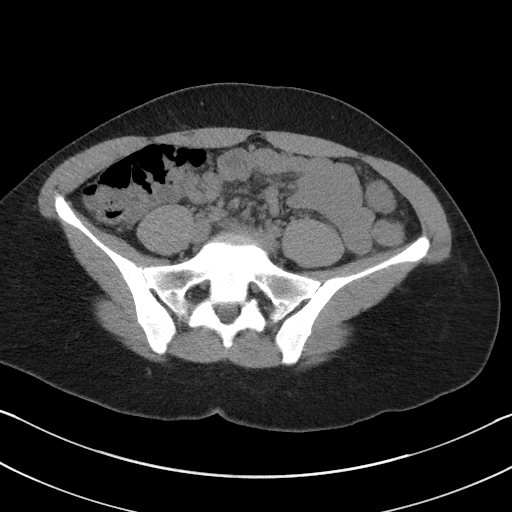
[im 38/93  soft-tissue]
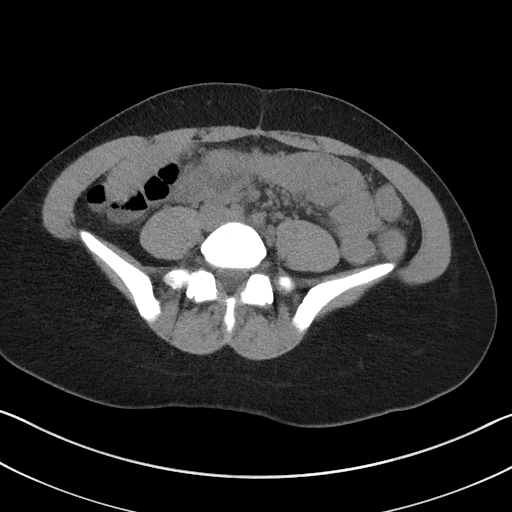
[im 47/93  soft-tissue]
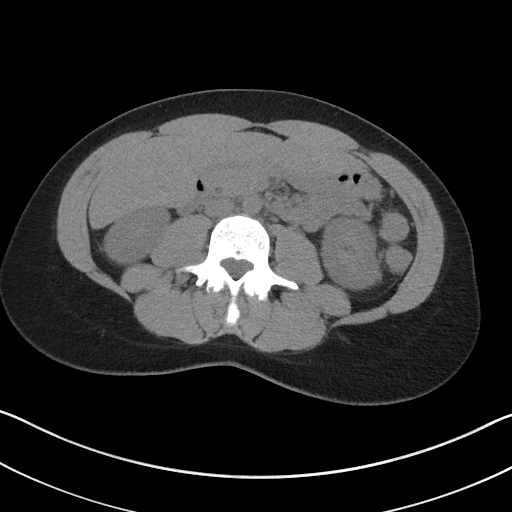
[im 55/93  soft-tissue]
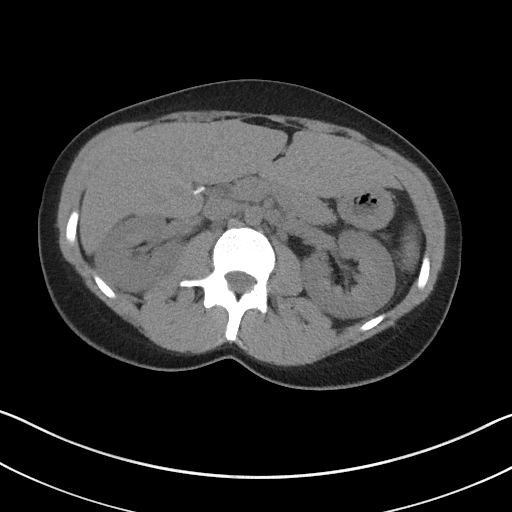
[im 59/93  soft-tissue]
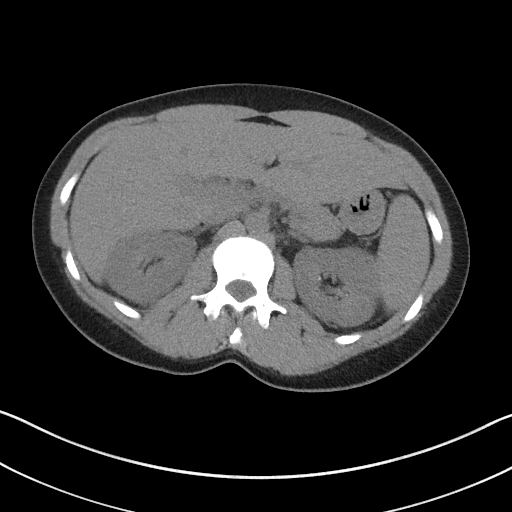
[im 59/93  bone]
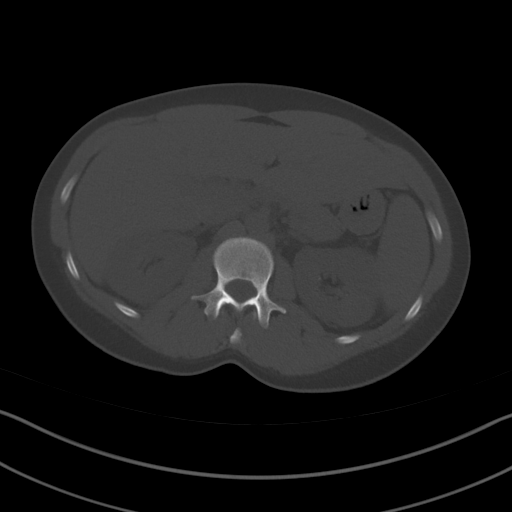
[im 67/93  soft-tissue]
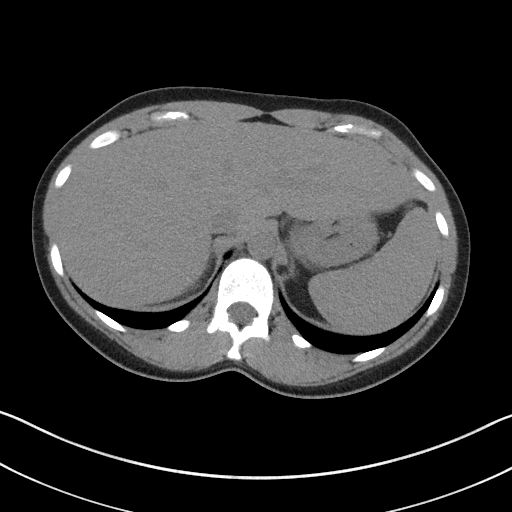
[im 72/93  soft-tissue]
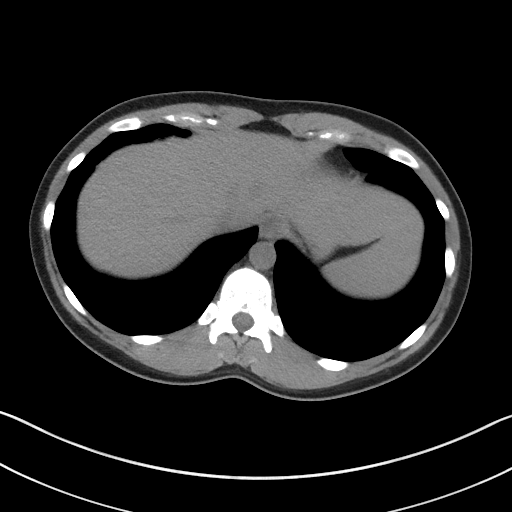
[im 80/93  soft-tissue]
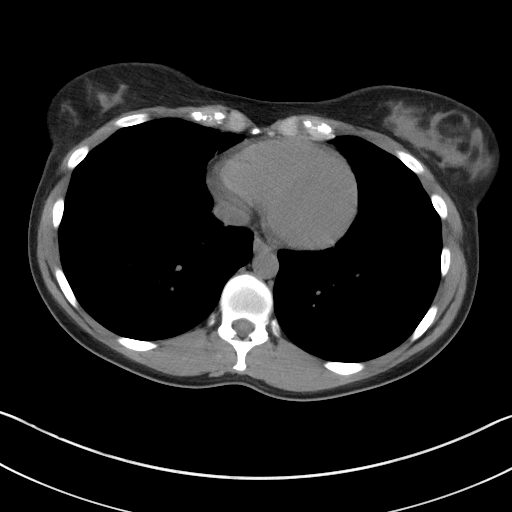
[im 88/93  soft-tissue]
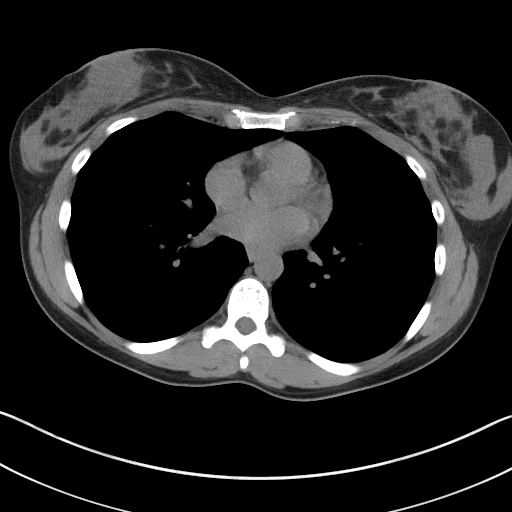

[Series 4: coronal st · coronal · 0.69mm/px · 3 of 83 slices shown]
[im 28/83  soft-tissue]
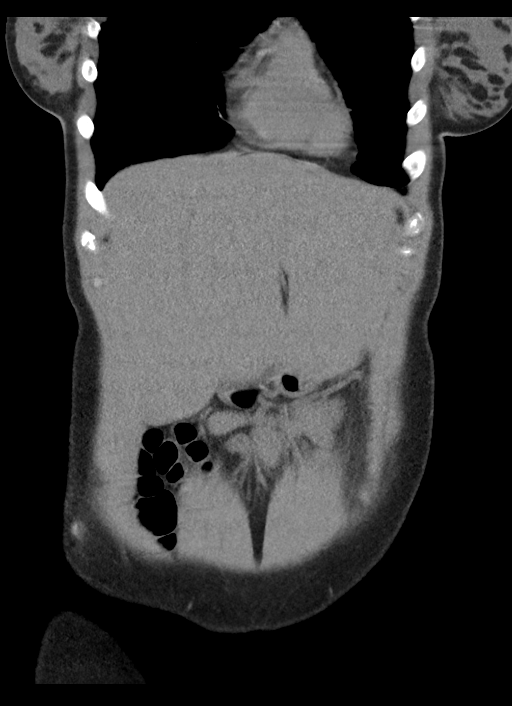
[im 37/83  soft-tissue]
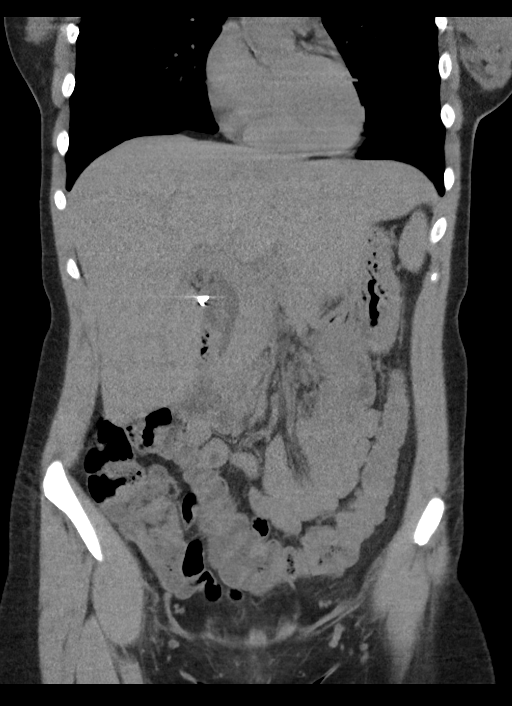
[im 46/83  soft-tissue]
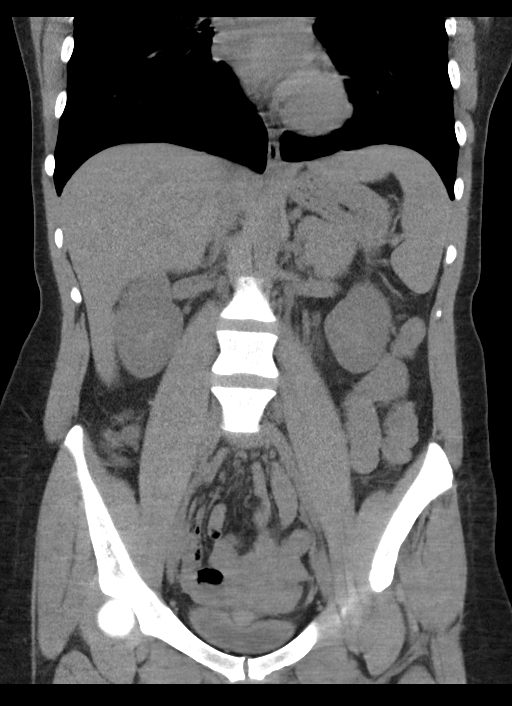

[16 of 46 positions shown; findings below may reference images not displayed]

FINDINGS: Lower chest: Motion artifact.  Lung bases are clear.

Hepatobiliary: Surgical absence of the gallbladder. No bile duct
dilatation. Unenhanced appearance of the liver is unremarkable.

Pancreas: Unenhanced appearance is unremarkable.

Spleen: Unenhanced appearance is unremarkable.

Adrenals/Urinary Tract: No adrenal gland nodules. Sub cm hyperdense
lesion in the upper pole left kidney probably represents a
hemorrhagic cyst. Small stones demonstrated in the mid and lower
pole of the left kidney. No right renal stones. No hydronephrosis or
hydroureter. No ureteral or bladder stones. Bladder is decompressed.

Stomach/Bowel: Stomach, small bowel, and colon are mostly
decompressed. Appendix is normal.

Vascular/Lymphatic: Normal caliber abdominal aorta. No significant
lymphadenopathy.

Reproductive: Uterus and ovaries are not enlarged.

Other: Abdominal wall musculature appears intact. No free air or
free fluid.

Musculoskeletal: No acute or significant osseous findings.
IMPRESSION: Nonobstructing intrarenal stones on the left kidney. Right kidney
and ureter appear normal. No evidence of bowel obstruction or
inflammation. Appendix is normal.

## 2022-07-18 ENCOUNTER — Ambulatory Visit (INDEPENDENT_AMBULATORY_CARE_PROVIDER_SITE_OTHER): Payer: No Typology Code available for payment source | Admitting: Adult Health

## 2022-07-18 ENCOUNTER — Encounter: Payer: Self-pay | Admitting: Adult Health

## 2022-07-18 ENCOUNTER — Other Ambulatory Visit (HOSPITAL_COMMUNITY)
Admission: RE | Admit: 2022-07-18 | Discharge: 2022-07-18 | Disposition: A | Discharge status: Home / Self Care | Payer: No Typology Code available for payment source | Source: Ambulatory Visit | Attending: Adult Health | Admitting: Adult Health

## 2022-07-18 VITALS — BP 100/66 | HR 73 | Ht 64.0 in | Wt 150.0 lb

## 2022-07-18 DIAGNOSIS — Z01419 Encounter for gynecological examination (general) (routine) without abnormal findings: Secondary | ICD-10-CM | POA: Diagnosis not present

## 2022-07-18 DIAGNOSIS — F3281 Premenstrual dysphoric disorder: Secondary | ICD-10-CM | POA: Diagnosis not present

## 2022-07-18 DIAGNOSIS — Z1211 Encounter for screening for malignant neoplasm of colon: Secondary | ICD-10-CM | POA: Diagnosis not present

## 2022-07-18 LAB — HEMOCCULT GUIAC POC 1CARD (OFFICE): Fecal Occult Blood, POC: NEGATIVE

## 2022-07-18 MED ORDER — NORETHIN ACE-ETH ESTRAD-FE 1-20 MG-MCG PO TABS: 1.0000 | ORAL_TABLET | Freq: Every day | ORAL | 11 refills | Status: DC

## 2022-07-18 NOTE — Progress Notes (Signed)
Patient ID: Lori Webster, female   DOB: 1981/06/03, 41 y.o.   MRN: [id number] History of Present Illness: Lori Webster is a 41 year old white female, single, G0P0, in for a well woman gyn exam and pap. 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 She is working [employer] and going back to school. Last pap was in 2017.  PCP is [facility].   Current Medications, Allergies, Past Medical History, Past Surgical History, Family History and Social History were reviewed in [employer] record.     Review of Systems: Patient denies any hearing loss, fatigue, blurred vision, shortness of breath, chest pain, abdominal pain, problems with urination, or intercourse(not active). No joint pain or mood swings.  +occasional headaches,on topamax Has BM every 3-4 days Has PMDD, for 2 weeks before period will feel tired, "ill", in a fog more anxiety,can't thinks straight    Physical Exam:BP 100/66 (BP Location: Right Arm, Patient Position: Sitting, Cuff Size: Normal)   Pulse 73   Ht '5\' 4"'$  (1.626 m)   Wt 150 lb (68 kg)   LMP 06/26/2022   BMI 25.75 kg/m   General:  Well developed, well nourished, no acute distress Skin:  Warm and dry Neck:  Midline trachea, normal thyroid, good ROM, no lymphadenopathy Lungs; Clear to auscultation bilaterally Breast:  No dominant palpable mass, retraction, or nipple discharge Cardiovascular: Regular rate and rhythm Abdomen:  Soft, non tender, no hepatosplenomegaly Pelvic:  External genitalia is normal in appearance, no lesions.  The vagina is normal in appearance. Urethra has no lesions or masses. The cervix is smooth, pap with  HR HPV genotyping performed. Uterus is felt to be normal size, shape, and contour.  No adnexal masses or tenderness noted.Bladder is non tender, no masses felt. Rectal: Good sphincter tone, no polyps, + hemorrhoids felt.  Hemoccult negative. Extremities/musculoskeletal:  No swelling or varicosities noted, no clubbing or cyanosis Psych:  No mood  changes, alert and cooperative,seems happy AA is 0 Fall risk is low    07/18/2022    8:43 AM  Depression screen PHQ 2/9  Decreased Interest 2  Down, Depressed, Hopeless 0  PHQ - 2 Score 2  Altered sleeping 0  Tired, decreased energy 2  Change in appetite 0  Feeling bad or failure about yourself  0  Trouble concentrating 2  Moving slowly or fidgety/restless 2  Suicidal thoughts 0  PHQ-9 Score 8       07/18/2022    8:44 AM  GAD 7 : Generalized Anxiety Score  Nervous, Anxious, on Edge 3  Control/stop worrying 2  Worry too much - different things 2  Trouble relaxing 0  Restless 0  Easily annoyed or irritable 2  Afraid - awful might happen 0  Total GAD 7 Score 9    Upstream - 07/18/22 0844       Pregnancy Intention Screening   Does the patient want to become pregnant in the next year? No    Does the patient's partner want to become pregnant in the next year? No    Would the patient like to discuss contraceptive options today? No      Contraception Wrap Up   Current Method Abstinence    End Method Abstinence    Contraception Counseling Provided No            Examination chaperoned by Levy Pupa LPN     Impression and Plan: 1. Encounter for gynecological examination with Papanicolaou smear of cervix Pap sent Pap in  3 years if normal  Physical in 1 year Labs with PCP - Cytology - PAP( [facility])  2. Encounter for screening fecal occult blood testing Hemoccult was negative  - POCT occult blood stool  3. PMDD (premenstrual dysphoric disorder) She declines SSRI, will try COCs, denies MI,stroke, DVT, breast cancer, smoking or migraine with aura To start with next period Meds ordered this encounter  Medications   norethindrone-ethinyl estradiol-FE (LOESTRIN FE) 1-20 MG-MCG tablet    Sig: Take 1 tablet by mouth daily.    Dispense:  28 tablet    Refill:  11    Order Specific Question:   Supervising Provider    Answer:   Tania Ade H [[id number]]    Follow  up in 3 months for ROS

## 2022-07-23 LAB — CYTOLOGY - PAP
Adequacy: ABSENT
Comment: NEGATIVE
Diagnosis: NEGATIVE
High risk HPV: NEGATIVE

## 2022-08-15 ENCOUNTER — Ambulatory Visit (INDEPENDENT_AMBULATORY_CARE_PROVIDER_SITE_OTHER): Payer: No Typology Code available for payment source | Admitting: Obstetrics & Gynecology

## 2022-08-15 ENCOUNTER — Encounter: Payer: Self-pay | Admitting: Obstetrics & Gynecology

## 2022-08-15 VITALS — BP 102/70 | HR 77 | Ht 63.0 in | Wt 154.8 lb

## 2022-08-15 DIAGNOSIS — T192XXA Foreign body in vulva and vagina, initial encounter: Secondary | ICD-10-CM

## 2022-08-15 DIAGNOSIS — W448XXA Other foreign body entering into or through a natural orifice, initial encounter: Secondary | ICD-10-CM | POA: Diagnosis not present

## 2022-08-15 NOTE — Progress Notes (Signed)
   GYN VISIT Patient name: Lori Webster MRN [id number]  Date of birth: 1981-01-15 Chief Complaint:   Foreign Body in Vagina (Retained tampon)  History of Present Illness:   Lori Webster is a 41 y.o. G0P0 female being seen today for the following concerns:  Retained tampon: Notes that she put a tampon in yesterday @ 1630.  Notes some odor, but no itching.  Tried to take it out and she couldn't- thinks she only got out some of it.  Denies pelvic or abdominal pain.  No other acute complaints  Patient's last menstrual period was 08/13/2022.     07/18/2022    8:43 AM  Depression screen PHQ 2/9  Decreased Interest 2  Down, Depressed, Hopeless 0  PHQ - 2 Score 2  Altered sleeping 0  Tired, decreased energy 2  Change in appetite 0  Feeling bad or failure about yourself  0  Trouble concentrating 2  Moving slowly or fidgety/restless 2  Suicidal thoughts 0  PHQ-9 Score 8     Review of Systems:   Pertinent items are noted in HPI Denies fever/chills, dizziness, headaches, visual disturbances, fatigue, shortness of breath, chest pain, abdominal pain, vomiting, no problems with periods, bowel movements, urination, or intercourse unless otherwise stated above.  Pertinent History Reviewed:  Reviewed past medical,surgical, social, obstetrical and family history.  Reviewed problem list, medications and allergies. Physical Assessment:   Vitals:   08/15/22 1104  BP: 102/70  Pulse: 77  Weight: 154 lb 12.8 oz (70.2 kg)  Height: '5\' 3"'$  (1.6 m)  Body mass index is 27.42 kg/m.       Physical Examination:   General appearance: alert, well appearing, and in no distress  Psych: mood appropriate, normal affect  Skin: warm & dry   Cardiovascular: normal heart rate noted  Respiratory: normal respiratory effort, no distress  Abdomen: soft, non-tender   Pelvic: VULVA: normal appearing vulva with no masses, tenderness or lesions, VAGINA: normal appearing vagina with normal color and discharge-  currently on menses- dark blood, small retained tampon noted and removed.  Redundant anterior vaginal wall.  CERVIX: normal appearing cervix without discharge or lesions, no active bleeding.  Bimanual completed, no further hygiene products or abnormalities noted.    Extremities: no edema   Chaperone:  pt declined     Assessment & Plan:  1) Retained tampon -removed without difficulty -f/u prn   No orders of the defined types were placed in this encounter.   Return if symptoms worsen or fail to improve.   Janyth Pupa, DO Attending [place], [facility] for [employer], [place]

## 2022-10-18 ENCOUNTER — Encounter: Payer: Self-pay | Admitting: Adult Health

## 2022-10-18 ENCOUNTER — Ambulatory Visit (INDEPENDENT_AMBULATORY_CARE_PROVIDER_SITE_OTHER): Payer: BLUE CROSS/BLUE SHIELD | Admitting: Adult Health

## 2022-10-18 VITALS — BP 116/73 | HR 86 | Ht 63.0 in | Wt 158.0 lb

## 2022-10-18 DIAGNOSIS — F3281 Premenstrual dysphoric disorder: Secondary | ICD-10-CM | POA: Diagnosis not present

## 2022-10-18 MED ORDER — PAROXETINE HCL 10 MG PO TABS: 10.0000 mg | ORAL_TABLET | Freq: Every day | ORAL | 2 refills | Status: DC

## 2022-10-18 NOTE — Progress Notes (Signed)
  Subjective:     Patient ID: Lori Webster, female   DOB: 12/07/1980, 42 y.o.   MRN: [id number]  HPI Lori Webster is a 42 year old white female,single, G0P0, back in follow up on starting Loestrin 1/20 in October for PMDD, and she stopped after 2 months, made the mental issues worse and had increased cramping.  She still has PMDD. She is self pay, insurance out of network.   Last pap was negative HPV and NILM 07/18/22.  PCP is Marcelle Overlie PA. Review of Systems +PMDD Feels bogged down at times Periods good, may last 3 days Not having sex  Reviewed past medical,surgical, social and family history. Reviewed medications and allergies.     Objective:   Physical Exam BP 116/73 (BP Location: Right Arm, Patient Position: Sitting, Cuff Size: Normal)   Pulse 86   Ht '5\' 3"'$  (1.6 m)   Wt 158 lb (71.7 kg)   LMP 10/12/2022 (Approximate)   BMI 27.99 kg/m  Skin warm and dry. Lungs were clear to auscultation bilaterally.  Cardiovascular: regular rate and rhythm.    Fall risk is low  Upstream - 10/18/22 0856       Pregnancy Intention Screening   Does the patient want to become pregnant in the next year? No    Does the patient's partner want to become pregnant in the next year? No    Would the patient like to discuss contraceptive options today? No      Contraception Wrap Up   Current Method Abstinence    End Method Abstinence    Contraception Counseling Provided No             Assessment:     1. PMDD (premenstrual dysphoric disorder) Will try Paxil Meds ordered this encounter  Medications   PARoxetine (PAXIL) 10 MG tablet    Sig: Take 1 tablet (10 mg total) by mouth daily.    Dispense:  30 tablet    Refill:  2    Order Specific Question:   Supervising Provider    Answer:   Florian Buff [[id number]]       Plan:    Message me on my chart in about 6 weeks or before for ROS  Follow up prn

## 2023-01-10 ENCOUNTER — Other Ambulatory Visit: Payer: Self-pay | Admitting: Adult Health

## 2023-03-11 ENCOUNTER — Other Ambulatory Visit: Payer: Self-pay | Admitting: Adult Health
